# Patient Record
Sex: Female | Born: 1980
Health system: Southern US, Community
[De-identification: ages and names within clinical notes are randomized; demographics above are authoritative.]

## PROBLEM LIST (undated history)

## (undated) ENCOUNTER — Inpatient Hospital Stay (HOSPITAL_COMMUNITY): Payer: Self-pay

## (undated) DIAGNOSIS — R87629 Unspecified abnormal cytological findings in specimens from vagina: Secondary | ICD-10-CM

## (undated) DIAGNOSIS — B977 Papillomavirus as the cause of diseases classified elsewhere: Secondary | ICD-10-CM

## (undated) DIAGNOSIS — F419 Anxiety disorder, unspecified: Secondary | ICD-10-CM

## (undated) DIAGNOSIS — O24419 Gestational diabetes mellitus in pregnancy, unspecified control: Secondary | ICD-10-CM

## (undated) DIAGNOSIS — K8020 Calculus of gallbladder without cholecystitis without obstruction: Secondary | ICD-10-CM

## (undated) HISTORY — PX: MOUTH SURGERY: SHX715

## (undated) HISTORY — DX: Unspecified abnormal cytological findings in specimens from vagina: R87.629

---
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
## 2002-05-27 ENCOUNTER — Other Ambulatory Visit: Admission: RE | Admit: 2002-05-27 | Discharge: 2002-05-27 | Payer: Self-pay | Admitting: *Deleted

## 2003-03-25 ENCOUNTER — Inpatient Hospital Stay (HOSPITAL_COMMUNITY): Admission: AD | Admit: 2003-03-25 | Discharge: 2003-03-27 | Payer: Self-pay | Admitting: *Deleted

## 2003-10-25 ENCOUNTER — Emergency Department (HOSPITAL_COMMUNITY): Admission: EM | Admit: 2003-10-25 | Discharge: 2003-10-25 | Payer: Self-pay | Admitting: Emergency Medicine

## 2004-04-29 ENCOUNTER — Emergency Department (HOSPITAL_COMMUNITY): Admission: EM | Admit: 2004-04-29 | Discharge: 2004-04-29 | Payer: Self-pay | Admitting: Emergency Medicine

## 2004-06-02 ENCOUNTER — Emergency Department (HOSPITAL_COMMUNITY): Admission: EM | Admit: 2004-06-02 | Discharge: 2004-06-02 | Payer: Self-pay | Admitting: Family Medicine

## 2004-09-05 ENCOUNTER — Inpatient Hospital Stay (HOSPITAL_COMMUNITY): Admission: AD | Admit: 2004-09-05 | Discharge: 2004-09-05 | Payer: Self-pay | Admitting: Family Medicine

## 2004-09-15 ENCOUNTER — Other Ambulatory Visit: Admission: RE | Admit: 2004-09-15 | Discharge: 2004-09-15 | Payer: Self-pay | Admitting: Obstetrics and Gynecology

## 2005-05-07 ENCOUNTER — Inpatient Hospital Stay (HOSPITAL_COMMUNITY): Admission: AD | Admit: 2005-05-07 | Discharge: 2005-05-09 | Payer: Self-pay | Admitting: Obstetrics and Gynecology

## 2005-05-18 ENCOUNTER — Emergency Department (HOSPITAL_COMMUNITY): Admission: EM | Admit: 2005-05-18 | Discharge: 2005-05-18 | Payer: Self-pay | Admitting: Emergency Medicine

## 2005-05-19 ENCOUNTER — Emergency Department (HOSPITAL_COMMUNITY): Admission: EM | Admit: 2005-05-19 | Discharge: 2005-05-19 | Payer: Self-pay | Admitting: Family Medicine

## 2005-09-12 ENCOUNTER — Other Ambulatory Visit: Admission: RE | Admit: 2005-09-12 | Discharge: 2005-09-12 | Payer: Self-pay | Admitting: Obstetrics and Gynecology

## 2006-05-30 ENCOUNTER — Emergency Department (HOSPITAL_COMMUNITY): Admission: EM | Admit: 2006-05-30 | Discharge: 2006-05-30 | Payer: Self-pay | Admitting: Family Medicine

## 2006-09-15 ENCOUNTER — Other Ambulatory Visit: Admission: RE | Admit: 2006-09-15 | Discharge: 2006-09-15 | Payer: Self-pay | Admitting: Obstetrics and Gynecology

## 2007-05-31 ENCOUNTER — Emergency Department (HOSPITAL_COMMUNITY): Admission: EM | Admit: 2007-05-31 | Discharge: 2007-05-31 | Payer: Self-pay | Admitting: Emergency Medicine

## 2007-10-29 ENCOUNTER — Emergency Department (HOSPITAL_COMMUNITY): Admission: EM | Admit: 2007-10-29 | Discharge: 2007-10-29 | Payer: Self-pay | Admitting: Family Medicine

## 2008-09-13 ENCOUNTER — Emergency Department (HOSPITAL_COMMUNITY): Admission: EM | Admit: 2008-09-13 | Discharge: 2008-09-13 | Payer: Self-pay | Admitting: Family Medicine

## 2009-05-04 ENCOUNTER — Emergency Department (HOSPITAL_COMMUNITY): Admission: EM | Admit: 2009-05-04 | Discharge: 2009-05-04 | Payer: Self-pay | Admitting: Family Medicine

## 2009-11-25 ENCOUNTER — Emergency Department (HOSPITAL_COMMUNITY): Admission: EM | Admit: 2009-11-25 | Discharge: 2009-11-25 | Payer: Self-pay | Admitting: Family Medicine

## 2010-11-21 ENCOUNTER — Emergency Department (HOSPITAL_COMMUNITY)
Admission: EM | Admit: 2010-11-21 | Discharge: 2010-11-21 | Payer: Self-pay | Source: Home / Self Care | Admitting: Family Medicine

## 2010-11-23 ENCOUNTER — Emergency Department (HOSPITAL_BASED_OUTPATIENT_CLINIC_OR_DEPARTMENT_OTHER): Admission: EM | Admit: 2010-11-23 | Discharge: 2010-11-23 | Payer: Self-pay | Admitting: Emergency Medicine

## 2011-03-29 LAB — POCT I-STAT, CHEM 8
BUN: 6 mg/dL (ref 6–23)
Calcium, Ion: 1.24 mmol/L (ref 1.12–1.32)
Chloride: 99 mEq/L (ref 96–112)
Creatinine, Ser: 0.9 mg/dL (ref 0.4–1.2)
Glucose, Bld: 78 mg/dL (ref 70–99)
HCT: 44 % (ref 36.0–46.0)
Hemoglobin: 15 g/dL (ref 12.0–15.0)
Potassium: 4.1 mEq/L (ref 3.5–5.1)
Sodium: 138 mEq/L (ref 135–145)
TCO2: 29 mmol/L (ref 0–100)

## 2011-03-29 LAB — WET PREP, GENITAL
Trich, Wet Prep: NONE SEEN
Yeast Wet Prep HPF POC: NONE SEEN

## 2011-03-29 LAB — POCT URINALYSIS DIP (DEVICE)
Bilirubin Urine: NEGATIVE
Glucose, UA: NEGATIVE mg/dL
Ketones, ur: NEGATIVE mg/dL
Nitrite: NEGATIVE
Protein, ur: NEGATIVE mg/dL
Specific Gravity, Urine: 1.01 (ref 1.005–1.030)
Urobilinogen, UA: 0.2 mg/dL (ref 0.0–1.0)
pH: 7 (ref 5.0–8.0)

## 2011-03-29 LAB — CBC
HCT: 38.6 % (ref 36.0–46.0)
Hemoglobin: 12.5 g/dL (ref 12.0–15.0)
MCHC: 32.4 g/dL (ref 30.0–36.0)
MCV: 77 fL — ABNORMAL LOW (ref 78.0–100.0)
Platelets: 352 10*3/uL (ref 150–400)
RBC: 5.01 MIL/uL (ref 3.87–5.11)
RDW: 13.3 % (ref 11.5–15.5)
WBC: 9.5 10*3/uL (ref 4.0–10.5)

## 2011-03-29 LAB — DIFFERENTIAL
Basophils Absolute: 0 10*3/uL (ref 0.0–0.1)
Basophils Relative: 0 % (ref 0–1)
Eosinophils Absolute: 0.1 10*3/uL (ref 0.0–0.7)
Eosinophils Relative: 1 % (ref 0–5)
Lymphocytes Relative: 36 % (ref 12–46)
Lymphs Abs: 3.4 10*3/uL (ref 0.7–4.0)
Monocytes Absolute: 0.4 10*3/uL (ref 0.1–1.0)
Monocytes Relative: 5 % (ref 3–12)
Neutro Abs: 5.5 10*3/uL (ref 1.7–7.7)
Neutrophils Relative %: 58 % (ref 43–77)

## 2011-03-29 LAB — POCT PREGNANCY, URINE: Preg Test, Ur: NEGATIVE

## 2011-03-29 LAB — GC/CHLAMYDIA PROBE AMP, GENITAL
Chlamydia, DNA Probe: NEGATIVE
GC Probe Amp, Genital: NEGATIVE

## 2011-04-05 LAB — POCT URINALYSIS DIP (DEVICE)
Bilirubin Urine: NEGATIVE
Glucose, UA: NEGATIVE mg/dL
Ketones, ur: NEGATIVE mg/dL
Nitrite: NEGATIVE
Protein, ur: NEGATIVE mg/dL
Specific Gravity, Urine: >=1.03 (ref 1.005–1.030)
Urobilinogen, UA: 1 mg/dL (ref 0.0–1.0)
pH: 6 (ref 5.0–8.0)

## 2011-04-05 LAB — WET PREP, GENITAL
Trich, Wet Prep: NONE SEEN
Yeast Wet Prep HPF POC: NONE SEEN

## 2011-04-05 LAB — POCT PREGNANCY, URINE: Preg Test, Ur: NEGATIVE

## 2011-04-05 LAB — GC/CHLAMYDIA PROBE AMP, GENITAL
Chlamydia, DNA Probe: NEGATIVE
GC Probe Amp, Genital: NEGATIVE

## 2011-04-15 ENCOUNTER — Emergency Department (INDEPENDENT_AMBULATORY_CARE_PROVIDER_SITE_OTHER): Payer: Self-pay

## 2011-04-15 ENCOUNTER — Emergency Department (HOSPITAL_BASED_OUTPATIENT_CLINIC_OR_DEPARTMENT_OTHER)
Admission: EM | Admit: 2011-04-15 | Discharge: 2011-04-15 | Disposition: A | Discharge status: Home / Self Care | Payer: Self-pay | Attending: Emergency Medicine | Admitting: Emergency Medicine

## 2011-04-15 DIAGNOSIS — R059 Cough, unspecified: Secondary | ICD-10-CM | POA: Insufficient documentation

## 2011-04-15 DIAGNOSIS — R05 Cough: Secondary | ICD-10-CM

## 2011-04-15 DIAGNOSIS — F17200 Nicotine dependence, unspecified, uncomplicated: Secondary | ICD-10-CM

## 2011-04-15 DIAGNOSIS — J40 Bronchitis, not specified as acute or chronic: Secondary | ICD-10-CM | POA: Insufficient documentation

## 2011-04-15 LAB — URINE MICROSCOPIC-ADD ON

## 2011-04-15 LAB — URINALYSIS, ROUTINE W REFLEX MICROSCOPIC
Bilirubin Urine: NEGATIVE
Glucose, UA: NEGATIVE mg/dL
Hgb urine dipstick: NEGATIVE
Ketones, ur: NEGATIVE mg/dL
Leukocytes, UA: NEGATIVE
Nitrite: NEGATIVE
Protein, ur: NEGATIVE mg/dL
Specific Gravity, Urine: 1.017 (ref 1.005–1.030)
Urobilinogen, UA: 0.2 mg/dL (ref 0.0–1.0)
pH: 7.5 (ref 5.0–8.0)

## 2011-04-15 LAB — PREGNANCY, URINE: Preg Test, Ur: NEGATIVE

## 2011-05-13 NOTE — H&P (Signed)
Samantha Harris              ACCOUNT NO.:  [id number]   MEDICAL RECORD NO.:  [id number]          PATIENT TYPE:  INP   LOCATION:  9164                          FACILITY:  [facility]   PHYSICIAN:  Crist Fat. Rivard, M.D. DATE OF BIRTH:  December 27, 1980   DATE OF ADMISSION:  05/07/2005  DATE OF DISCHARGE:                                HISTORY & PHYSICAL   HISTORY OF PRESENT ILLNESS:  Samantha Harris is a 30 year old single black  female, gravida 2, para 1-0-1-1, at 41-1/7 weeks who presents complaining of  leaking since about 2 a.m. and now with uterine contractions every five  minutes.  She denies any nausea, vomiting, headaches, or visual  disturbances.  She reports positive fetal movement.  She reports that she  was seen in the office on 5/12 and was about 2 cm at that time.  Her  pregnancy has been followed at [facility] by the certified  nurse midwife service and has been at risk for:  1. Questionable LMP.  2.  History of migraines.  3. History of HPV.  4. Positive group B strep.   OBSTETRICAL-GYNECOLOGICAL HISTORY:  She is a gravida 2, para 1-0-0-1, who  delivered a viable female infant in 3/04 who weighed 7 pounds, 5 ounces at [redacted]  weeks gestation following a 4 hour labor without complication.  She did have  an epidural for that delivery.  She also was GBS positive with that  delivery.   GENERAL MEDICAL HISTORY:  She has no known drug allergies.  She reports  having had the usual childhood diseases.  She has a history of occasional  migraines and her only hospitalization was for childbirth.   FAMILY HISTORY:  Significant for a father with hypertension and heart  disease, sister and fraternal grandmother with diabetes, mother with some  kind of cancer, and there is a family history of colon cancer.   GENETIC HISTORY:  Negative.  She is still involved with the same partner as  her previous child.  She is of the [place] faith.  She denies any illicit  drug use, alcohol, or  smoking with this pregnancy.   PRENATAL LABORATORIES:  Her blood type is O positive, her antibody screen is  negative.  Sickle cell trait is negative.  Syphilis is nonreactive.  Rubella  is immune.  Hepatitis B  surface antigen is negative.  HIV is nonreactive.  Cystic fibrosis is negative.  GC and Chlamydia are both negative.  Pap is  within normal limits.  Her group B strep at 36 weeks is positive.   PHYSICAL EXAMINATION:  Her vital signs are stable.  She is afebrile.  Her  HEENT is grossly within normal limits.  Her heart is regular rhythm and  rate.  Her chest is clear.  Breasts are soft and nontender.  Her abdomen is  gravid.  Her uterine contractions are every 4-6 minutes.  Fetal heart rate  is reactive.  Serial pelvic exam is 6 cm, 100% vertex at +1 with meconium  stained fluid noted per the RN.  Her extremities are within normal limits.   ASSESSMENT:  1.  Intrauterine pregnancy at 41-1/7 weeks.  2. Active labor.  3. Meconium      stained fluid.  4. Positive group B strep.  5. History of rapid labor.   PLAN:  Admit to labor and delivery, to follow routine CNM orders, to give  her ampicillin IV for group B strep prophylaxis, to anticipate imminent  delivery, and to notify Dr. Estanislado Pandy of patient's admission.      SJD/MEDQ  D:  05/07/2005  T:  05/07/2005  Job:  [id number]

## 2011-05-13 NOTE — H&P (Signed)
NAMEHEATHERLY, STENNER                        ACCOUNT NO.:  [id number]   MEDICAL RECORD NO.:  [id number]                   PATIENT TYPE:  MAT   LOCATION:  MATC                                 [facility]:  [facility]   PHYSICIAN:  Crist Fat. Rivard, M.D.              DATE OF BIRTH:  [birth year]   DATE OF ADMISSION:  03/25/2003  DATE OF DISCHARGE:                                HISTORY & PHYSICAL   HISTORY:  Ms. Benassi is a 30 year old single black female prima gravida at  40-2/7 weeks who presents with leaking fluid since 0100 Hr.  She reports  uterine contractions, irregular all night, and more regular after rupture of  membranes.  She denies bleeding, headache, nausea, vomiting or visual  disturbances.  Her pregnancy has been followed by [facility]  certified nurse midwife service and has been remarkable for:  1. History of HPV.  2. Group B strep positive.   PRENATAL LABS:  Collected on 08/20/02.  Hemoglobin 11.4, hematocrit 33.9,  platelets 405,000.  Blood type O positive, antibody negative.  Sickle cell  trait negative.  RPR nonreactive.  Rubella immune.  Hepatitis B surface  antigen negative.  HIV nonreactive.  Pap smear within normal limits.  Gonorrhea negative, Chlamydia negative.  Wet prep negative.  Maternal fetal  alpha fetoprotein on 09/25/02 was within normal range.  One hour Glucola on  12/18/02 was 64 and her hemoglobin at that time was 10.4. Culture of the  vaginal tract for group B strep 02/28/03 was positive and for gonorrhea and  Chlamydia at that time was negative.   HISTORY OF PRESENT PREGNANCY:  She presented for care on 09/25/02 at 14-1/[redacted]  weeks gestation to [facility] for prenatal care.  She was  transferring from the [facility].  Pregnancy ultrasonography at [redacted] weeks  gestation shows growth consistent with previous dating with EDC of 03/23/03.  The rest of her pr ental care was unremarkable.   OB HISTORY:  She is a primigravida.   MEDICAL  HISTORY:  She has no medication allergies.  In 8/03 she was  diagnosed with genital warts. She has had yeast infections with this  pregnancy.  She reports having had the usual childhood illnesses.  She has a  history of migraines.   SURGERIES:  She has had no surgeries.   FAMILY HISTORY:  Remarkable for a father with myocardial infarction.  Paternal grandfather and paternal grandmother with chronic hypertension.  Sister and paternal grandmother with diabetes.  Mother with ulcers. Colon  cancer runs in the family.  Paternal grandmother with CVA.  Mother, brother  and father with history of migraines.  Mother with history of sexual abuse.   GENETIC HISTORY:  Unremarkable.   SOCIAL HISTORY:  The father of the baby is involved and supportive Minerva Areola).  They are of the [place] faith.  The patient has a high school education  and works part-time as  a Conservation officer, nature.  The father of the baby has a tenth grade  education and is employed in [employer].  She denies any alcohol or  illicit drug use with the pregnancy.  She smoked up until 8/03.   PHYSICAL EXAMINATION:  VITAL SIGNS: Stable, she is afebrile.  HEENT: Grossly within normal limits.  CHEST: Clear to auscultation.  HEART: Regular rate and rhythm.  ABDOMEN: Gravid in contour with fundal height extending approximately 40 cm  above the pubic symphysis.  Fetal heart rate is reassuring with occasional  mild variables.  Uterine contractions every one and one-half minutes lasting  70 seconds and strong to palpation.  CERVICAL:  Five cm, 100% effaced, vertex -1 with caput present and no  membrane felt on exam.  No sterile speculum exam was performed secondary to  patient discomfort but positive Nitrazine and negative fern to an external  swab, no fluid is visible.  EXTREMITIES: Within normal limits.   ASSESSMENT:  1. Intrauterine pregnancy at term.  2. Active labor.  3. Probable spontaneous rupture of membranes.  4. Group B strep  positive.   PLAN:  1. Admit emergently and call for consultation with Dr. Estanislado Pandy.  2. Routine ___ and waters.  3. Penicillin G for group B strep prophylaxis.     Cam Hai, C.N.M.                     Crist Fat Rivard, M.D.    KS/MEDQ  D:  03/25/2003  T:  03/25/2003  Job:  [id number]   cc:   Dois Davenport A. Rivard, M.D.  [address]  [place]  [place] [address]  Fax: [phone]

## 2011-07-06 ENCOUNTER — Inpatient Hospital Stay (INDEPENDENT_AMBULATORY_CARE_PROVIDER_SITE_OTHER)
Admission: RE | Admit: 2011-07-06 | Discharge: 2011-07-06 | Disposition: A | Discharge status: Home / Self Care | Payer: Self-pay | Source: Ambulatory Visit | Attending: Emergency Medicine | Admitting: Emergency Medicine

## 2011-07-06 DIAGNOSIS — IMO0002 Reserved for concepts with insufficient information to code with codable children: Secondary | ICD-10-CM

## 2011-07-06 DIAGNOSIS — N949 Unspecified condition associated with female genital organs and menstrual cycle: Secondary | ICD-10-CM

## 2011-07-06 LAB — POCT URINALYSIS DIP (DEVICE)
Bilirubin Urine: NEGATIVE
Glucose, UA: NEGATIVE mg/dL
Hgb urine dipstick: NEGATIVE
Ketones, ur: NEGATIVE mg/dL
Leukocytes, UA: NEGATIVE
Nitrite: NEGATIVE
Protein, ur: NEGATIVE mg/dL
Specific Gravity, Urine: 1.02 (ref 1.005–1.030)
Urobilinogen, UA: 0.2 mg/dL (ref 0.0–1.0)
pH: 7 (ref 5.0–8.0)

## 2011-07-06 LAB — POCT PREGNANCY, URINE: Preg Test, Ur: NEGATIVE

## 2011-09-26 LAB — POCT URINALYSIS DIP (DEVICE)
Glucose, UA: NEGATIVE
Ketones, ur: NEGATIVE
Nitrite: NEGATIVE
Operator id: 30745
Protein, ur: NEGATIVE
Specific Gravity, Urine: 1.025
Urobilinogen, UA: 2 — ABNORMAL HIGH
pH: 6.5

## 2011-09-26 LAB — POCT PREGNANCY, URINE: Preg Test, Ur: POSITIVE

## 2011-10-13 LAB — POCT PREGNANCY, URINE
Operator id: 272551
Preg Test, Ur: NEGATIVE

## 2011-10-13 LAB — RAPID STREP SCREEN (MED CTR MEBANE ONLY): Streptococcus, Group A Screen (Direct): NEGATIVE

## 2011-11-28 ENCOUNTER — Ambulatory Visit (INDEPENDENT_AMBULATORY_CARE_PROVIDER_SITE_OTHER): Payer: Medicaid Other | Admitting: Family Medicine

## 2011-11-28 ENCOUNTER — Encounter: Payer: Self-pay | Admitting: Family Medicine

## 2011-11-28 DIAGNOSIS — F4001 Agoraphobia with panic disorder: Secondary | ICD-10-CM | POA: Insufficient documentation

## 2011-11-28 DIAGNOSIS — N938 Other specified abnormal uterine and vaginal bleeding: Secondary | ICD-10-CM

## 2011-11-28 DIAGNOSIS — Z720 Tobacco use: Secondary | ICD-10-CM | POA: Insufficient documentation

## 2011-11-28 DIAGNOSIS — F17200 Nicotine dependence, unspecified, uncomplicated: Secondary | ICD-10-CM

## 2011-11-28 DIAGNOSIS — N949 Unspecified condition associated with female genital organs and menstrual cycle: Secondary | ICD-10-CM

## 2011-11-28 MED ORDER — BUSPIRONE HCL 7.5 MG PO TABS: 7.5000 mg | ORAL_TABLET | Freq: Two times a day (BID) | ORAL | Status: DC

## 2011-11-28 NOTE — Assessment & Plan Note (Signed)
Will start with buspar for treatment.  Patient encouraged to consider therapy for coping strategies.  She will follow up in a month or two for well woman exam, will see how her anxiety symptoms are at that time.

## 2011-11-28 NOTE — Assessment & Plan Note (Signed)
Spent some time discussing reasons/motivations for quitting, barriers to quitting.  Patient in contemplative stage, has not decided she wants a nicotine replacement or chantix at this point, but knows they are available.

## 2011-11-28 NOTE — Progress Notes (Signed)
  Subjective:    Patient ID: Samantha Harris, female    DOB: 12-Aug-1981, 30 y.o.   MRN: [id number]  HPI  Patient comes in to establish care.  She has several complaints.    Patient used to see an OB doctor about 10 years ago.  She says that she had some pelvic pain and abnormal bleeding back then, and they had planned to get an [place], but she never had it done.  This was shortly after her second son was born.  She says that right now her periods are every 28 days, and last about 4 days, and she does not have much pain with menstruation.    She also complains that she has had some increased anxiety and panic attacks lately.  She says she has always been claustrophobic, especially in elevators.  However, recently, she has had difficulty going into crowded stores.  She says she has had a few panic attacks, she says she feels very hot and feels like she is in danger, and has to leave the public place.  She has never been on medication or had any therapy for her anxiety or panic attacks.   The patient also wants to quit smoking.  She says she has been smoking on and off for more than 10 years, but quit during her two pregnancies.  She wants to quit because her two young sons don't like that she smokes, and for her long-term health.  She has cut back from smoking about two packs per week to about one pack over a week's time.  She has not tried any nicotine replacement, and states she might want to try cold [place].   Review of Systems Pertinent items noted in HPI    Objective:   Physical Exam BP 125/77  Pulse 94  Temp(Src) 98.3 F (36.8 C) (Oral)  Ht 5' 6.5" (1.689 m)  Wt 212 lb (96.163 kg)  BMI 33.71 kg/m2  LMP 11/08/2011 General appearance: alert, cooperative and no distress Throat: lips, mucosa, and tongue normal; teeth and gums normal Neck: no adenopathy, supple, symmetrical, trachea midline and thyroid not enlarged, symmetric, no tenderness/mass/nodules Lungs: clear to auscultation  bilaterally Heart: regular rate and rhythm, S1, S2 normal, no murmur, click, rub or gallop Abdomen: soft, non-tender; bowel sounds normal; no masses,  no organomegaly Extremities: extremities normal, atraumatic, no cyanosis or edema Pulses: 2+ and symmetric Psych: Patient has normal thought content and judgement, appears calm, normal speech.  Does not appear to be responding to any internal stimuli.        Assessment & Plan:

## 2011-11-28 NOTE — Patient Instructions (Signed)
It was nice to meet you.  We will try to get records from your old OB GYN to sort out why they wanted an ultrasound.  Please try the medication Buspirone for your anxiety.  If you think therapy would help you work on your panic attacks, please contact Dr. Pascal Lux, our psychologist.    Please come back and see me for your well woman exam in the next few months.

## 2011-11-28 NOTE — Assessment & Plan Note (Signed)
Patient is not currently having symptoms, but is concerned that old OB wanted [place].  Will get records to see why they wanted an [place].  Patient to follow up in 1-2 months for well woman exam.

## 2012-03-06 ENCOUNTER — Encounter: Payer: Self-pay | Admitting: Family Medicine

## 2012-03-06 ENCOUNTER — Ambulatory Visit (INDEPENDENT_AMBULATORY_CARE_PROVIDER_SITE_OTHER): Payer: Medicaid Other | Admitting: Family Medicine

## 2012-03-06 VITALS — BP 117/76 | HR 91 | Temp 98.1°F | Ht 66.5 in | Wt 213.0 lb

## 2012-03-06 DIAGNOSIS — Z800 Family history of malignant neoplasm of digestive organs: Secondary | ICD-10-CM

## 2012-03-06 DIAGNOSIS — F17200 Nicotine dependence, unspecified, uncomplicated: Secondary | ICD-10-CM

## 2012-03-06 DIAGNOSIS — L732 Hidradenitis suppurativa: Secondary | ICD-10-CM | POA: Insufficient documentation

## 2012-03-06 DIAGNOSIS — Z720 Tobacco use: Secondary | ICD-10-CM

## 2012-03-06 DIAGNOSIS — F4001 Agoraphobia with panic disorder: Secondary | ICD-10-CM

## 2012-03-06 NOTE — Progress Notes (Signed)
  Subjective:    Patient ID: Samantha Harris, female    DOB: October 18, 1981, 31 y.o.   MRN: [id number]  HPI  Samantha Harris comes in for follow up.  Samantha Harris was supposed to get her pap today but is on her period and declines it.    Anxiety- Samantha Harris says Samantha Harris had stopped taking the buspar after a few weeks when it was first prescribed because Samantha Harris felt out of it.  However, Samantha Harris started school in January, so Samantha Harris started taking it again.  Samantha Harris says that the medication helps her be able to go into a crowded room with minimal anxiety, and no panic attacks.  Samantha Harris says Samantha Harris is doing OK concentrating at school.  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   Tobacco abuse- Samantha Harris has cut back, now smoking a pack over about a 2 week period.  Samantha Harris says it is very important to her to quit, and that the only reason Samantha Harris smokes is because Samantha Harris likes how it relaxes her.  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   Hidradenitis- Pt says Samantha Harris does not have any symptoms, but has had this again since Samantha Harris saw me last.  Samantha Harris wants to know what it is and how to make it go away.  Samantha Harris remembers her OBGYN telling her that weight loss can help.    Family History of Colon Cancer- Samantha Harris states that Samantha Harris talked to her mother and found out for sure that her mother was diagnosed with colon cancer at the age of 56.  Samantha Harris does not know specifically what kind of colon cancer it was.  Samantha Harris has never had a colonoscopy, and no one else in the family has had colon cancer.  Samantha Harris has no difficulty with bowel movements, changes in bowel habits, blood in her stool.    Review of Systems Pertinent items in HPI.     Objective:   Physical Exam BP 117/76  Pulse 91  Temp(Src) 98.1 F (36.7 C) (Oral)  Ht 5' 6.5" (1.689 m)  Wt 213 lb (96.616 kg)  BMI 33.86 kg/m2  LMP 03/06/2012 General appearance: alert, cooperative and no distress Head: Normocephalic, without obvious abnormality, atraumatic Lungs: clear to auscultation bilaterally Heart: regular rate  and rhythm, S1, S2 normal, no murmur, click, rub or gallop Abdomen: soft, non-tender; bowel sounds normal; no masses,  no organomegaly Extremities: extremities normal, atraumatic, no cyanosis or edema and No abscess in axilla currently but there is scarring of old drained abscess.  Pulses: 2+ and symmetric Skin: Skin color, texture, turgor normal. No rashes or lesions Neurologic: Grossly normal Psych: Patient appears calm, in good mood, judgement insight, thought content and speech normal.        Assessment & Plan:

## 2012-03-06 NOTE — Patient Instructions (Signed)
It was good to see you.  I am glad your anxiety is better and that you are able to concentrate at school.  Please keep trying to quit smoking- you are almost there.    Please make an appointment in about 3 months (when you are not on your period) so we can do your pap smear.  The office will call you about a referral to Gastroenterology (stomach doctors).

## 2012-03-06 NOTE — Assessment & Plan Note (Signed)
Will refer to GI, pt likely needs screening colonoscopy soon.

## 2012-03-06 NOTE — Assessment & Plan Note (Signed)
Improved on buspar, patient able to function better.  No changes in medication, will monitor.

## 2012-03-06 NOTE — Assessment & Plan Note (Signed)
No current symptoms, but did provide pt hand out about condition.  Encouraged weight loss.

## 2012-03-06 NOTE — Assessment & Plan Note (Signed)
Improvement towards goal, patient declines any further assistance, I will continue to encourage her.

## 2012-03-14 ENCOUNTER — Encounter: Payer: Self-pay | Admitting: Internal Medicine

## 2012-03-20 ENCOUNTER — Ambulatory Visit (INDEPENDENT_AMBULATORY_CARE_PROVIDER_SITE_OTHER): Payer: Medicaid Other | Admitting: Family Medicine

## 2012-03-20 VITALS — BP 125/76 | HR 90 | Temp 98.7°F | Ht 66.5 in | Wt 209.0 lb

## 2012-03-20 DIAGNOSIS — L732 Hidradenitis suppurativa: Secondary | ICD-10-CM

## 2012-03-20 MED ORDER — DOXYCYCLINE HYCLATE 100 MG PO TABS: 100.0000 mg | ORAL_TABLET | Freq: Two times a day (BID) | ORAL | Status: AC

## 2012-03-20 NOTE — Patient Instructions (Signed)
Hidradenitis Suppurativa, Sweat Gland Abscess Hidradenitis suppurativa is a long lasting (chronic), uncommon disease of the sweat glands. With this, boil-like lumps and scarring develop in the groin, some times under the arms (axillae), and under the breasts. It may also uncommonly occur behind the ears, in the crease of the buttocks, and around the genitals.  CAUSES  The cause is from a blocking of the sweat glands. They then become infected. It may cause drainage and odor. It is not contagious. So it cannot be given to someone else. It most often shows up in puberty (about 10 to 30 years of age). But it may happen much later. It is similar to acne which is a disease of the sweat glands. This condition is slightly more common in African-Americans and women. SYMPTOMS   Hidradenitis usually starts as one or more red, tender, swellings in the groin or under the arms (axilla).   Over a period of hours to days the lesions get larger. They often open to the skin surface, draining clear to yellow-colored fluid.   The infected area heals with scarring.  DIAGNOSIS  Your caregiver makes this diagnosis by looking at you. Sometimes cultures (growing germs on plates in the lab) may be taken. This is to see what germ (bacterium) is causing the infection.  TREATMENT   Topical germ killing medicine applied to the skin (antibiotics) are the treatment of choice. Antibiotics taken by mouth (systemic) are sometimes needed when the condition is getting worse or is severe.   Avoid tight-fitting clothing which traps moisture in.   Dirt does not cause hidradenitis and it is not caused by poor hygiene.   Involved areas should be cleaned daily using an antibacterial soap. Some patients find that the liquid form of Lever 2000, applied to the involved areas as a lotion after bathing, can help reduce the odor related to this condition.   Sometimes surgery is needed to drain infected areas or remove scarred tissue.  Removal of large amounts of tissue is used only in severe cases.   Birth control pills may be helpful.   Oral retinoids (vitamin A derivatives) for 6 to 12 months which are effective for acne may also help this condition.   Weight loss will improve but not cure hidradenitis. It is made worse by being overweight. But the condition is not caused by being overweight.   This condition is more common in people who have had acne.   It may become worse under stress.  There is no medical cure for hidradenitis. It can be controlled, but not cured. The condition usually continues for years with periods of getting worse and getting better (remission). Document Released: 07/26/2004 Document Revised: 12/01/2011 Document Reviewed: 08/11/2008 ExitCare Patient Information 2012 [employer]. 

## 2012-03-21 NOTE — Progress Notes (Signed)
  Subjective:    Patient ID: Samantha Harris, female    DOB: [birth year], 31 y.o.   MRN: [id number]  HPI Pt presents today with chief complaint of R axilla swelling and pain.  Symptoms have been present for the last 3-4 days. No fever or purulent drainage. Mild redness and swelling. Pain has been poorly controlled with NSAIDs and tylenol.  Pt states that she has a prior history of hidradenitis in the past. This is similar to previous presentation.    Review of Systems     Objective:   Physical Exam Gen: in bed, NAD  Skin:         Assessment & Plan:

## 2012-03-22 NOTE — Assessment & Plan Note (Addendum)
Acute flare today. Will place on oral doxy. Discussed supportive care and infectious red flags. No indications for I and D today. Did broach issue of intralesional steroids if lesions recalcitrant. Handout given.

## 2012-05-07 ENCOUNTER — Encounter: Payer: Self-pay | Admitting: Family Medicine

## 2012-05-07 ENCOUNTER — Ambulatory Visit (INDEPENDENT_AMBULATORY_CARE_PROVIDER_SITE_OTHER): Payer: Medicaid Other | Admitting: Family Medicine

## 2012-05-07 ENCOUNTER — Other Ambulatory Visit (HOSPITAL_COMMUNITY)
Admission: RE | Admit: 2012-05-07 | Discharge: 2012-05-07 | Disposition: A | Discharge status: Home / Self Care | Payer: Medicaid Other | Source: Ambulatory Visit | Attending: Family Medicine | Admitting: Family Medicine

## 2012-05-07 VITALS — BP 134/84 | HR 99 | Temp 97.8°F | Ht 67.0 in | Wt 212.0 lb

## 2012-05-07 DIAGNOSIS — L732 Hidradenitis suppurativa: Secondary | ICD-10-CM

## 2012-05-07 DIAGNOSIS — N912 Amenorrhea, unspecified: Secondary | ICD-10-CM

## 2012-05-07 DIAGNOSIS — Z01419 Encounter for gynecological examination (general) (routine) without abnormal findings: Secondary | ICD-10-CM | POA: Insufficient documentation

## 2012-05-07 DIAGNOSIS — N760 Acute vaginitis: Secondary | ICD-10-CM

## 2012-05-07 DIAGNOSIS — Z124 Encounter for screening for malignant neoplasm of cervix: Secondary | ICD-10-CM

## 2012-05-07 DIAGNOSIS — Z113 Encounter for screening for infections with a predominantly sexual mode of transmission: Secondary | ICD-10-CM | POA: Insufficient documentation

## 2012-05-07 DIAGNOSIS — Z3201 Encounter for pregnancy test, result positive: Secondary | ICD-10-CM | POA: Insufficient documentation

## 2012-05-07 LAB — POCT URINE PREGNANCY: Preg Test, Ur: POSITIVE

## 2012-05-07 LAB — POCT WET PREP (WET MOUNT)

## 2012-05-07 NOTE — Assessment & Plan Note (Signed)
I told her this was positive, she became upset, she is not sure who the father is, and unsure of her last menstrual period. Will order dating [place].  Patient is not sure if she wants this pregnancy or not.  I let her know she can get her prenatal care her at our clinic if she wants, but I am happy to make any referrals she needs. Patient states she will call her best friend as soon as she leaves and does not need me to call anyone for her.

## 2012-05-07 NOTE — Assessment & Plan Note (Signed)
This has improved.  Discussed disease process and that prophylactic surgery would be an extreme step, that surgery was more of a last resort option.  Discussed weight loss as best treatment.

## 2012-05-07 NOTE — Progress Notes (Signed)
  Subjective:    Patient ID: Samantha Harris, female    DOB: 03-Nov-1981, 31 y.o.   MRN: [id number]  HPI  Samantha Harris comes in for her well woman exam. She says she would like a pregnancy test and STD check.  She says she had unprotected intercourse with a new partner sometime in the middle of April.  She thinks she had her last period at the beginning of April, but is unsure.    She says the boil under her armpit has gotten better, she is no longer having as much pain.  She wanted to ask about surgery to prevent it from happening again.   Review of Systems  Constitutional: Negative for unexpected weight change.  HENT: Positive for congestion and postnasal drip.   Eyes: Negative for visual disturbance.  Respiratory: Negative for shortness of breath.   Cardiovascular: Negative for chest pain.  Gastrointestinal: Negative for abdominal pain.  Genitourinary: Negative for dysuria.  Skin: Negative for rash.       Objective:   Physical Exam  Vitals reviewed. Constitutional: She is oriented to person, place, and time. She appears well-developed and well-nourished.  HENT:  Head: Normocephalic and atraumatic.  Eyes: EOM are normal. Pupils are equal, round, and reactive to light.  Neck: No thyromegaly present.  Cardiovascular: Normal rate, regular rhythm and normal heart sounds.   Pulmonary/Chest: Effort normal and breath sounds normal.  Abdominal: Soft. Bowel sounds are normal.  Genitourinary: Vagina normal and uterus normal. Cervix exhibits no motion tenderness. Right adnexum displays no mass. Left adnexum displays no mass. No vaginal discharge found.  Lymphadenopathy:    She has no cervical adenopathy.  Neurological: She is alert and oriented to person, place, and time.          Assessment & Plan:

## 2012-05-07 NOTE — Assessment & Plan Note (Signed)
Pap and STD screening done, no abnormalities noted.

## 2012-05-07 NOTE — Patient Instructions (Signed)
Syerra, your pregnancy test today was positive.  Since you are unsure of the day of your last period, I have ordered a early dating ultrasound.  I will call you when I see the results from this ultrasound.  I also will call or send you a letter with the results of the other tests done today.

## 2012-05-10 ENCOUNTER — Telehealth: Payer: Self-pay | Admitting: Family Medicine

## 2012-05-10 NOTE — Telephone Encounter (Signed)
The patient called to ask about what cough medicine is safe to take given that she is pregnant. She was advised that mucinex is safe.

## 2012-05-11 ENCOUNTER — Telehealth: Payer: Self-pay | Admitting: Family Medicine

## 2012-05-11 ENCOUNTER — Ambulatory Visit (HOSPITAL_COMMUNITY)
Admission: RE | Admit: 2012-05-11 | Discharge: 2012-05-11 | Disposition: A | Discharge status: Home / Self Care | Payer: Medicaid Other | Source: Ambulatory Visit | Attending: Family Medicine | Admitting: Family Medicine

## 2012-05-11 ENCOUNTER — Encounter (HOSPITAL_COMMUNITY): Payer: Self-pay

## 2012-05-11 ENCOUNTER — Other Ambulatory Visit: Payer: Self-pay | Admitting: Family Medicine

## 2012-05-11 DIAGNOSIS — Z3689 Encounter for other specified antenatal screening: Secondary | ICD-10-CM | POA: Insufficient documentation

## 2012-05-11 DIAGNOSIS — Z3201 Encounter for pregnancy test, result positive: Secondary | ICD-10-CM

## 2012-05-11 DIAGNOSIS — E669 Obesity, unspecified: Secondary | ICD-10-CM | POA: Insufficient documentation

## 2012-05-11 HISTORY — DX: Papillomavirus as the cause of diseases classified elsewhere: B97.7

## 2012-05-11 HISTORY — DX: Anxiety disorder, unspecified: F41.9

## 2012-05-11 NOTE — Telephone Encounter (Signed)
Called patient to discuss her Ultrasound and dating that has confirmed a single intrauterine pregnancy, due date 01/02/13.  She says she called [facility] (where she got her prior OB care) to see if she could get an appointment, and has not heard back.  I let her know she can call the front office to schedule labs and then an OB appointment here if she wants.   I also let her know pap smear OK and GC/Chlamydia negative.

## 2012-05-29 ENCOUNTER — Ambulatory Visit: Payer: Medicaid Other | Admitting: Internal Medicine

## 2012-06-04 ENCOUNTER — Other Ambulatory Visit: Payer: Medicaid Other

## 2012-06-06 ENCOUNTER — Other Ambulatory Visit: Payer: Self-pay

## 2012-06-06 ENCOUNTER — Telehealth: Payer: Self-pay | Admitting: Family Medicine

## 2012-06-06 DIAGNOSIS — Z3201 Encounter for pregnancy test, result positive: Secondary | ICD-10-CM

## 2012-06-06 LAB — HIV ANTIBODY (ROUTINE TESTING W REFLEX): HIV: NONREACTIVE

## 2012-06-06 NOTE — Progress Notes (Signed)
PRENATAL LABS DONE S TODAY Samantha Harris

## 2012-06-06 NOTE — Telephone Encounter (Signed)
Patient is calling to have Pregnancy verification for renewal of Medicaid sent to Ms. Bracey Fax # [phone].  She needs it to also say [facility].

## 2012-06-07 LAB — OBSTETRIC PANEL
Antibody Screen: NEGATIVE
Basophils Absolute: 0 10*3/uL (ref 0.0–0.1)
Basophils Relative: 0 % (ref 0–1)
Eosinophils Absolute: 0.2 10*3/uL (ref 0.0–0.7)
Eosinophils Relative: 2 % (ref 0–5)
HCT: 33.1 % — ABNORMAL LOW (ref 36.0–46.0)
MCH: 22.9 pg — ABNORMAL LOW (ref 26.0–34.0)
MCHC: 31.4 g/dL (ref 30.0–36.0)
Monocytes Absolute: 0.4 10*3/uL (ref 0.1–1.0)
Neutro Abs: 5.6 10*3/uL (ref 1.7–7.7)
RDW: 15.3 % (ref 11.5–15.5)
Rh Type: POSITIVE

## 2012-06-07 LAB — CULTURE, OB URINE: Colony Count: 4000

## 2012-06-07 LAB — SICKLE CELL SCREEN: Sickle Cell Screen: NEGATIVE

## 2012-06-07 NOTE — Telephone Encounter (Signed)
Verification faxed to [phone].

## 2012-06-11 ENCOUNTER — Ambulatory Visit (INDEPENDENT_AMBULATORY_CARE_PROVIDER_SITE_OTHER): Payer: Self-pay | Admitting: Family Medicine

## 2012-06-11 ENCOUNTER — Encounter: Payer: Self-pay | Admitting: Family Medicine

## 2012-06-11 VITALS — BP 117/73 | Wt 211.0 lb

## 2012-06-11 DIAGNOSIS — Z331 Pregnant state, incidental: Secondary | ICD-10-CM

## 2012-06-11 DIAGNOSIS — Z3201 Encounter for pregnancy test, result positive: Secondary | ICD-10-CM

## 2012-06-11 MED ORDER — ONDANSETRON HCL 4 MG PO TABS: 4.0000 mg | ORAL_TABLET | Freq: Three times a day (TID) | ORAL | Status: AC | PRN

## 2012-06-11 NOTE — Progress Notes (Signed)
Note reviewed.  Following issues noted: Obesity and +FH diabetes: needs glucola asap.  Pt should come for lab visit for this. +marijuana, alcohol and tobacco use - needs counseling and resources for quitting.   Pt would be excellent candidate for pregnancy medical home case management services if she could qualify for medicaid.  If not, would refer to social work.

## 2012-06-11 NOTE — Progress Notes (Signed)
S: Samantha Harris presents for her first OB visit.  She is 10w 6d.  This was an unplanned pregnancy.  She is in a relationship with the FOB.  She denies any vaginal bleeding or leakage of fluids.  She complains of some nausea and poor appetite, but no vomiting.  She also complains of some difficulty falling asleep.  Otherwise no complaints  OB History    Grav Para Term Preterm Abortions TAB SAB Ect Mult Living   5 2 2  2     2      # Outc Date GA Lbr Len/2nd Wgt Sex Del Anes PTL Lv   1 TRM 3/04 [redacted]w[redacted]d 24:00 7lb4oz(3.289kg) M SVD EPI  Yes   2 TRM 5/06 [redacted]w[redacted]d 24:00 8lb2oz(3.685kg) M SVD EPI No Yes   3 ABT 8/09 [redacted]w[redacted]d          4 ABT 9/10 [redacted]w[redacted]d          5 CUR              Past Medical History  Diagnosis Date  . HPV in female   . Migraine   . Anxiety    She has a history of abnormal Pap, but most recent Pap was normal (04/2012).   O:  See Flowsheet  A/P: 31 year old G5P2022 @ [redacted]w[redacted]d by LMP (confirmed by first trimester [place]), presents for Initial Prenatal visit:  - Prenatal labs reviewed all WNL except mild anemia, patient advised to take PNV with Iron - Nausea- Rx for zofran, also discussed ginger supplements and small bland meals  - Difficulty sleeping- advised OK to take Benardyl prn for sleep - Genetic testing discussed, patient will think about options, discuss again next visit.  - Bleeding/pain precautions - F/U in 4 weeks, will obtain early glucola at that time for obesity

## 2012-06-11 NOTE — Patient Instructions (Addendum)
You are 10 weeks and 6 days.  Please be sure to take a pre natal vitamin, and you should take an iron supplement too.  I have sent a prescription for zofran for your nausea to the pharmacy, but you can also try Ginger supplements.    If you have abdominal/pelvic pain, or bleeding, please call the office or go to [facility] hospital.

## 2012-06-12 ENCOUNTER — Telehealth: Payer: Self-pay | Admitting: Family Medicine

## 2012-06-12 DIAGNOSIS — E669 Obesity, unspecified: Secondary | ICD-10-CM | POA: Insufficient documentation

## 2012-06-12 DIAGNOSIS — Z3201 Encounter for pregnancy test, result positive: Secondary | ICD-10-CM

## 2012-06-12 NOTE — Telephone Encounter (Signed)
Called patient- let her know Dr. [place] felt she needed to have Glucola earlier than 4 weeks from now.  Scheduled her for lab visit on Friday June 21st at 9:15 am, Glucola ordered.

## 2012-06-15 ENCOUNTER — Other Ambulatory Visit: Payer: Self-pay

## 2012-06-15 NOTE — Progress Notes (Signed)
1 HR GTT DONE TODAY,3 HR GTT SCHEDULE FOR 06/22/12 Mirabelle Cyphers

## 2012-06-22 ENCOUNTER — Other Ambulatory Visit: Payer: Self-pay

## 2012-06-22 DIAGNOSIS — Z331 Pregnant state, incidental: Secondary | ICD-10-CM

## 2012-06-22 LAB — GLUCOSE TOLERANCE, 3 HOURS
Glucose Tolerance, 1 hour: 75 mg/dL (ref 70–189)
Glucose Tolerance, 2 hour: 83 mg/dL (ref 70–164)
Glucose Tolerance, Fasting: 74 mg/dL (ref 70–104)
Glucose, GTT - 3 Hour: 79 mg/dL (ref 70–144)

## 2012-06-22 NOTE — Progress Notes (Signed)
3 HR GTT DONE TODAY Samantha Harris 

## 2012-07-13 ENCOUNTER — Ambulatory Visit (INDEPENDENT_AMBULATORY_CARE_PROVIDER_SITE_OTHER): Payer: Medicaid Other | Admitting: Family Medicine

## 2012-07-13 VITALS — BP 122/78 | Temp 98.4°F | Wt 209.0 lb

## 2012-07-13 DIAGNOSIS — Z331 Pregnant state, incidental: Secondary | ICD-10-CM | POA: Insufficient documentation

## 2012-07-13 NOTE — Patient Instructions (Addendum)
Please call [facility] to ask about breast feeding classes.    Your second blood sugar test was normal- but we will repeat it when you are around 28 weeks.    Please make an appointment for your next check up in 4 weeks in OB clinic.

## 2012-07-13 NOTE — Progress Notes (Signed)
Samantha Harris comes in for OB follow up.  She has had a few headaches and some constipation and wants to know what is safe to take during pregnancy.  Otherwise she is feeling well.  She has not started to feel the baby move yet, but has not had bleeding or vaginal discharge.  She denies any tobacco, alcohol, or drug use since finding out she was pregnant.  She is interested in going to breast feeding classes at [facility].  31 year old G5P2022 @ [redacted]w[redacted]d:  - Obesity- weight is stable- encouraged her to continue healthy diet,  Early 1 hour Glucola was elevated, 3 hour was normal.  Will repeat at 28 weeks.  - Anatomy scan ordered for August 9th - Bleeding/pain precautions reviewed - Advised tylenol ok for headaches, miralax for constipation - Follow up in 4 weeks in [facility] clinic

## 2012-08-02 ENCOUNTER — Encounter: Payer: Self-pay | Admitting: Family Medicine

## 2012-08-02 ENCOUNTER — Ambulatory Visit (INDEPENDENT_AMBULATORY_CARE_PROVIDER_SITE_OTHER): Payer: Medicaid Other | Admitting: Family Medicine

## 2012-08-02 VITALS — BP 110/68 | Wt 211.5 lb

## 2012-08-02 DIAGNOSIS — Z3490 Encounter for supervision of normal pregnancy, unspecified, unspecified trimester: Secondary | ICD-10-CM

## 2012-08-02 DIAGNOSIS — Z331 Pregnant state, incidental: Secondary | ICD-10-CM

## 2012-08-02 NOTE — Progress Notes (Signed)
31 yr old G5P2 @[redacted]w[redacted]d  by LMP, seen today in OB clinic.  Discussed genetic screening, she wishes to have serum quad screen.  Wishes to breast feed.  Has remained abstinent from tobacco and THC for duration of pregnancy. Is feeling quickening, denies ctx, bleeding, discharge.  Anatomy scan scheduled for tomorrow. Patient has plans for independent paternity testing.  Plan to follow up with Dr Lula Olszewski in 4 weeks. She will need repeat 1hrGTT at 26-[redacted] weeks gestation.  Paula Compton, MD

## 2012-08-02 NOTE — Patient Instructions (Addendum)
It was a pleasure to see you today in [facility].  You are at gestational age [redacted]weeks 2 days today.   Please continue to take the Prenatal vitamin and the iron supplement.  (Unable to find combination iron and vitamin).  We are ordering the genetic screening blood test today.   Please follow up with Dr. Lula Olszewski in 4 weeks for your next OB visit.

## 2012-08-03 ENCOUNTER — Ambulatory Visit (HOSPITAL_COMMUNITY)
Admission: RE | Admit: 2012-08-03 | Discharge: 2012-08-03 | Disposition: A | Discharge status: Home / Self Care | Payer: Medicaid Other | Source: Ambulatory Visit | Attending: Family Medicine | Admitting: Family Medicine

## 2012-08-03 DIAGNOSIS — Z3689 Encounter for other specified antenatal screening: Secondary | ICD-10-CM | POA: Insufficient documentation

## 2012-08-03 DIAGNOSIS — E669 Obesity, unspecified: Secondary | ICD-10-CM | POA: Insufficient documentation

## 2012-08-03 DIAGNOSIS — Z331 Pregnant state, incidental: Secondary | ICD-10-CM

## 2012-08-10 ENCOUNTER — Telehealth: Payer: Self-pay | Admitting: Family Medicine

## 2012-08-10 MED ORDER — PRENATAL VITAMINS 0.8 MG PO TABS: 1.0000 | ORAL_TABLET | Freq: Every day | ORAL | Status: AC

## 2012-08-10 NOTE — Telephone Encounter (Signed)
Patient is calling because she asked the MD at her last appt to send an Rx for Prenatal Vitamins that are covered by Medicaid to her pharmacy, but nothing was sent.  She uses [facility] on [address].

## 2012-08-14 ENCOUNTER — Ambulatory Visit (INDEPENDENT_AMBULATORY_CARE_PROVIDER_SITE_OTHER): Payer: Medicaid Other | Admitting: Internal Medicine

## 2012-08-14 ENCOUNTER — Encounter: Payer: Self-pay | Admitting: Internal Medicine

## 2012-08-14 VITALS — BP 100/50 | HR 100 | Ht 66.25 in | Wt 214.0 lb

## 2012-08-14 DIAGNOSIS — Z800 Family history of malignant neoplasm of digestive organs: Secondary | ICD-10-CM

## 2012-08-14 DIAGNOSIS — K5900 Constipation, unspecified: Secondary | ICD-10-CM

## 2012-08-14 NOTE — Progress Notes (Signed)
Samantha Harris [birth year] MRN [id number]   History of Present Illness:  This is a 31 year old African American female in her fifth month of her third pregnancy. She is here to discuss a screening colonoscopy. Her mother had colorectal cancer at the age of 36 and is doing well. Her mother is currently 48 years old. She denies any GI symptoms except for mild constipation caused by taking iron supplements. Her delivery day is in January 2014. She would like to have her colonoscopy in Spring 2014.   Past Medical History  Diagnosis Date  . HPV in female   . Migraine   . Anxiety    Past Surgical History  Procedure Date  . Mouth surgery     reports that she has quit smoking. Her smoking use included Cigarettes. She has never used smokeless tobacco. She reports that she does not drink alcohol or use illicit drugs. family history includes Asthma in her brother; Clotting disorder in her paternal grandmother; Colon cancer (age of onset:35) in her mother; Diabetes in her paternal grandmother and sister; Heart disease in her father; Hypertension in her father; and Stroke in her paternal grandmother. No Known Allergies      Review of Systems: Negative for abdominal pain heartburn blood in stool  The remainder of the 10 point ROS is negative except as outlined in H&P   Physical Exam: General appearance  Well developed, in no distress. Eyes- non icteric. HEENT nontraumatic, normocephalic. Mouth no lesions, tongue papillated, no cheilosis. Neck supple without adenopathy, thyroid not enlarged, no carotid bruits, no JVD. Lungs Clear to auscultation bilaterally. Cor normal S1, normal S2, regular rhythm, no murmur,  quiet precordium. Abdomen: Protuberant abdomen consistent with the [redacted] weeks gestation. Normoactive bowel sounds. No tenderness. Rectal: Not done. Extremities no pedal edema. Skin no lesions. Neurological alert and oriented x 3. Psychological normal mood and affect.  Assessment  and Plan:  Problem #20 31 year old [place] American female with a strong family history of colon cancer in her mother at the age of 45. She will be an appropriate candidate for a screening colonoscopy after she delivers in January 2014. We will send her a letter of recall for March 2014. In the meantime, she will continue her iron supplements and a high-fiber diet.   08/14/2012 Lina Sar

## 2012-08-14 NOTE — Patient Instructions (Addendum)
You will be due for a recall colonoscopy in 02/2013. We will send you a reminder in the mail when it gets closer to that time. CC: Dr Ardyth Gal

## 2012-08-30 ENCOUNTER — Encounter: Payer: Medicaid Other | Admitting: Family Medicine

## 2012-09-05 ENCOUNTER — Ambulatory Visit (INDEPENDENT_AMBULATORY_CARE_PROVIDER_SITE_OTHER): Payer: Medicaid Other | Admitting: Family Medicine

## 2012-09-05 ENCOUNTER — Encounter: Payer: Self-pay | Admitting: Family Medicine

## 2012-09-05 VITALS — BP 114/74 | Wt 217.0 lb

## 2012-09-05 DIAGNOSIS — Z331 Pregnant state, incidental: Secondary | ICD-10-CM

## 2012-09-05 DIAGNOSIS — Z3480 Encounter for supervision of other normal pregnancy, unspecified trimester: Secondary | ICD-10-CM

## 2012-09-05 NOTE — Progress Notes (Signed)
31 year old [id number] @ [redacted]w[redacted]d who presents for routine OB follow up.  She is doing well and has no complaints.  Reports good fetal movement, no bleeding or discharge.   - Plans to breast feed - undecided about contraception, possibly mini pill - Baby is female, wants Circumcision  - Anatomy scan reviewed, no concerns.  - Quad screen negative - Reviewed pre-term labor precautions - reviewed weight gain goals again (has gained 6 lbs since last visit)  - f/u in 4 weeks, will repeat Glucola and draw labs that visit.

## 2012-09-05 NOTE — Patient Instructions (Signed)
It was good to see you.  Please continue to take your vitamins and iron supplement.  At your next visit we will repeat the one hour glucola test and draw lab work.    I will see you in 4 weeks.

## 2012-09-09 ENCOUNTER — Inpatient Hospital Stay (HOSPITAL_COMMUNITY)
Admission: AD | Admit: 2012-09-09 | Discharge: 2012-09-09 | Disposition: A | Discharge status: Home / Self Care | Payer: Medicaid Other | Source: Ambulatory Visit | Attending: Obstetrics and Gynecology | Admitting: Obstetrics and Gynecology

## 2012-09-09 ENCOUNTER — Encounter (HOSPITAL_COMMUNITY): Payer: Self-pay | Admitting: *Deleted

## 2012-09-09 DIAGNOSIS — R109 Unspecified abdominal pain: Secondary | ICD-10-CM | POA: Insufficient documentation

## 2012-09-09 DIAGNOSIS — B3731 Acute candidiasis of vulva and vagina: Secondary | ICD-10-CM | POA: Insufficient documentation

## 2012-09-09 DIAGNOSIS — O212 Late vomiting of pregnancy: Secondary | ICD-10-CM | POA: Insufficient documentation

## 2012-09-09 DIAGNOSIS — O99891 Other specified diseases and conditions complicating pregnancy: Secondary | ICD-10-CM | POA: Insufficient documentation

## 2012-09-09 DIAGNOSIS — K529 Noninfective gastroenteritis and colitis, unspecified: Secondary | ICD-10-CM

## 2012-09-09 DIAGNOSIS — B373 Candidiasis of vulva and vagina: Secondary | ICD-10-CM | POA: Insufficient documentation

## 2012-09-09 DIAGNOSIS — K5289 Other specified noninfective gastroenteritis and colitis: Secondary | ICD-10-CM | POA: Insufficient documentation

## 2012-09-09 DIAGNOSIS — O2390 Unspecified genitourinary tract infection in pregnancy, unspecified trimester: Secondary | ICD-10-CM | POA: Insufficient documentation

## 2012-09-09 LAB — URINALYSIS, ROUTINE W REFLEX MICROSCOPIC
Ketones, ur: NEGATIVE mg/dL
Leukocytes, UA: NEGATIVE
Protein, ur: NEGATIVE mg/dL
Urobilinogen, UA: 0.2 mg/dL (ref 0.0–1.0)

## 2012-09-09 LAB — WET PREP, GENITAL

## 2012-09-09 MED ORDER — FLUCONAZOLE 150 MG PO TABS: 150.0000 mg | ORAL_TABLET | Freq: Once | ORAL | Status: AC

## 2012-09-09 NOTE — MAU Note (Signed)
THINKS MIGHT BE GETTING YEAST INFECTION-  ITCHING STARTED YESTERDAY.

## 2012-09-09 NOTE — MAU Note (Signed)
PT SAYS SHE AWOKE AT 1145PM -  FEELING BAD.  BEFORE WENT TO BED - LOWER BACK WAS HURTING.       WENT TO B-ROOM - VOIDED- THEN  HAD   DIARRHEA-  THEN VOMITED.      ATE AT 9PM-- FISH, SHRIMP , FRIES AND  REESES PIECES.      WHILE IN B-ROOM - FELT ABD TIGHTENING.   DOES NOT FEEL NAUSEA- NOR DIARRHEA NOW  BUT    STILL FEELS CRAMPS.

## 2012-09-09 NOTE — MAU Provider Note (Signed)
History   Samantha Harris is a 31 y.o. [phone] female at [redacted]w[redacted]d who presents with report of eating baked shrimp, fish sticks, french fries and a handful of reeses pieces at app 2130.  At 1150 she developed mid abdominal cramping, nausea w/ vomiting x 1 and 1 episode of diarrhea, w/ none since.  Reports good fm.  Denies LOF or VB.  Does report vaginal itching x 2 days, thinks she may have a yeast infection.  Receives pnc at [facility] by Dr. Lula Olszewski.  Next appt 10/6.    CSN: [id number]  Arrival date and time: 09/09/12 1478   First Provider Initiated Contact with Patient 09/09/12 0159      No chief complaint on file.  HPI  OB History    Grav Para Term Preterm Abortions TAB SAB Ect Mult Living   5 2 2  2 2    2       Past Medical History  Diagnosis Date  . HPV in female   . Migraine   . Anxiety     Past Surgical History  Procedure Date  . Mouth surgery     Family History  Problem Relation Age of Onset  . Colon cancer Mother 78  . Heart disease Father   . Hypertension Father   . Diabetes Sister   . Asthma Brother   . Diabetes Paternal Grandmother   . Stroke Paternal Grandmother   . Clotting disorder Paternal Grandmother     History  Substance Use Topics  . Smoking status: Former Smoker    Types: Cigarettes  . Smokeless tobacco: Never Used  . Alcohol Use: No     before pregnancy    Allergies: No Known Allergies  Prescriptions prior to admission  Medication Sig Dispense Refill  . acetaminophen (TYLENOL) 500 MG tablet Take 500 mg by mouth as needed.      . ferrous sulfate 325 (65 FE) MG tablet Take 325 mg by mouth daily with breakfast.      . Prenatal Multivit-Min-Fe-FA (PRENATAL VITAMINS) 0.8 MG tablet Take 1 tablet by mouth daily.  30 tablet  11    Review of Systems  Constitutional: Negative.  Negative for fever and chills.  HENT: Negative.   Eyes: Negative.   Respiratory: Negative.   Cardiovascular: Negative.   Gastrointestinal: Positive for nausea  (earlier, but denies now), vomiting (earlier x 1, none since), abdominal pain (mid abdominal cramping began at app 1145, right before n/v/d) and diarrhea (x1 earlier, none since).  Genitourinary: Negative for dysuria, urgency, frequency and hematuria.       Vaginal itching x 2 days, no change in d/c, thinks she may have yeast infection  Musculoskeletal: Negative.   Skin: Negative.   Neurological: Negative.   Endo/Heme/Allergies: Negative.   Psychiatric/Behavioral: Negative.    Physical Exam   Height 5\' 6"  (1.676 m), weight 99.428 kg (219 lb 3.2 oz), last menstrual period 03/27/2012.  Physical Exam  Constitutional: She is oriented to person, place, and time. She appears well-developed and well-nourished.  HENT:  Head: Normocephalic.  Neck: Normal range of motion.  Cardiovascular: Normal rate and regular rhythm.   Respiratory: Effort normal.  GI: Soft.       gravid  Genitourinary:       Spec exam: cervix visually closed, thick white clumpy d/c, wet prep obtained  SVE: LTC  Musculoskeletal: Normal range of motion.  Neurological: She is alert and oriented to person, place, and time. She has normal reflexes.  Skin: Skin is warm  and dry.  Psychiatric: She has a normal mood and affect. Her behavior is normal. Judgment and thought content normal.   FHR: 140, mod variability, 10x10accels, no decels= Cat I UCs: mild UI   MAU Course  Procedures  EFM Spec exam w/ wet prep SVE  Results for orders placed during the hospital encounter of 09/09/12 (from the past 24 hour(s))  URINALYSIS, ROUTINE W REFLEX MICROSCOPIC     Status: Normal   Collection Time   09/09/12  1:05 AM      Component Value Range   Color, Urine YELLOW  YELLOW   APPearance CLEAR  CLEAR   Specific Gravity, Urine 1.025  1.005 - 1.030   pH 6.0  5.0 - 8.0   Glucose, UA NEGATIVE  NEGATIVE mg/dL   Hgb urine dipstick NEGATIVE  NEGATIVE   Bilirubin Urine NEGATIVE  NEGATIVE   Ketones, ur NEGATIVE  NEGATIVE mg/dL    Protein, ur NEGATIVE  NEGATIVE mg/dL   Urobilinogen, UA 0.2  0.0 - 1.0 mg/dL   Nitrite NEGATIVE  NEGATIVE   Leukocytes, UA NEGATIVE  NEGATIVE  WET PREP, GENITAL     Status: Abnormal   Collection Time   09/09/12  2:10 AM      Component Value Range   Yeast Wet Prep HPF POC MANY (*) NONE SEEN   Trich, Wet Prep NONE SEEN  NONE SEEN   Clue Cells Wet Prep HPF POC FEW (*) NONE SEEN   WBC, Wet Prep HPF POC MODERATE (*) NONE SEEN    Samantha Harris, Samantha Harris  Home Medication Instructions [id number]   Printed on:09/09/12 0255  Medication Information                    Prenatal Multivit-Min-Fe-FA (PRENATAL VITAMINS) 0.8 MG tablet Take 1 tablet by mouth daily.           ferrous sulfate 325 (65 FE) MG tablet Take 325 mg by mouth daily with breakfast.           acetaminophen (TYLENOL) 500 MG tablet Take 500 mg by mouth as needed.           fluconazole (DIFLUCAN) 150 MG tablet Take 1 tablet (150 mg total) by mouth once.            Follow-up Information    Follow up with [facility]- Dr. Lula Olszewski. (as scheduled on 10/6 or sooner if you feel like you need to be seen.  Return the hospital  as needed or  if symptoms worsen)          Assessment and Plan  A:  [redacted]w[redacted]d SIUP  Probable gastroenteritis r/t food poisoning  Vulvovaginal candidiasis    P:  D/C home  Rx: Diflucan 150mg  po x 1, may repeat in 3days if needed  Keep appt as scheduled w/ Dr. Lula Olszewski on 10/6  Stay hydrated  Return to hospital prn  Marge Duncans 09/09/2012, 2:00 AM

## 2012-09-10 NOTE — MAU Provider Note (Signed)
Agree with above note.  Samantha Harris 09/10/2012 2:18 PM   

## 2012-09-13 ENCOUNTER — Ambulatory Visit: Payer: Medicaid Other | Admitting: Family Medicine

## 2012-09-26 ENCOUNTER — Ambulatory Visit (INDEPENDENT_AMBULATORY_CARE_PROVIDER_SITE_OTHER): Payer: Medicaid Other | Admitting: Family Medicine

## 2012-09-26 VITALS — BP 134/75 | Temp 97.4°F | Wt 222.0 lb

## 2012-09-26 DIAGNOSIS — Z331 Pregnant state, incidental: Secondary | ICD-10-CM

## 2012-09-26 DIAGNOSIS — Z3480 Encounter for supervision of other normal pregnancy, unspecified trimester: Secondary | ICD-10-CM

## 2012-09-26 LAB — GLUCOSE, CAPILLARY
Comment 1: 1
Glucose-Capillary: 160 mg/dL — ABNORMAL HIGH (ref 70–99)

## 2012-09-26 NOTE — Patient Instructions (Signed)
It was good to see you.  You are 26 weeks and 1 day along.  Please be sure to keep your appointment tomorrow for your 3-hour glucola.    If you want to make a nutrition visit- please ask the front desk to schedule you any time.  Please make your next visit for about 3 weeks, after that we will see you ever two weeks.

## 2012-09-26 NOTE — Progress Notes (Signed)
31 year old [id number] @ [redacted]w[redacted]d presents for OB follow up.  She is feeling well, denies any vaginal bleeding, discharge, or contractions.  Reports good Fetal movement.  - Failed 1 hour glucola, scheduled for 3 hour tomorrow - Initial BP elevated, repeat was 122/74. Will monitor closely.  - Reviewed weight gain goals, fetal kick counts, preterm labor precautions.  - Will draw HIV, RPR, and CBC with glucola tomorrow.  - plans to breast feed, still not sure about contraception, wants baby to have circumcision.  - f/u in 2-3 weeks.

## 2012-09-27 ENCOUNTER — Other Ambulatory Visit: Payer: Medicaid Other

## 2012-09-27 DIAGNOSIS — Z331 Pregnant state, incidental: Secondary | ICD-10-CM

## 2012-09-27 LAB — CBC
HCT: 32.3 % — ABNORMAL LOW (ref 36.0–46.0)
Hemoglobin: 10.1 g/dL — ABNORMAL LOW (ref 12.0–15.0)
MCH: 23.6 pg — ABNORMAL LOW (ref 26.0–34.0)
MCHC: 31.3 g/dL (ref 30.0–36.0)
RDW: 14.9 % (ref 11.5–15.5)

## 2012-09-27 NOTE — Progress Notes (Signed)
3 HR GTT AND 28 WEEK LABS DONE TODAY Samantha Harris

## 2012-09-28 LAB — RPR

## 2012-09-28 LAB — GLUCOSE TOLERANCE, 3 HOURS
Glucose Tolerance, 2 hour: 121 mg/dL (ref 70–164)
Glucose Tolerance, Fasting: 75 mg/dL (ref 70–104)
Glucose, GTT - 3 Hour: 95 mg/dL (ref 70–144)

## 2012-10-01 ENCOUNTER — Encounter: Payer: Self-pay | Admitting: Family Medicine

## 2012-10-08 ENCOUNTER — Telehealth: Payer: Self-pay | Admitting: *Deleted

## 2012-10-08 NOTE — Telephone Encounter (Signed)
Patient completed form to request PCP change.  Form states "My concern is that from day 1 I haven't really felt a doctor/patient relationship with her.  I remained this long with her hoping it may change, but has not.  Experiencing assistance with other doctors in [facility] I feel as if the visit/experience was a much better and genuine one.  She may work for someone else, but I would like to request another female doctor please and if none available, I will just accept a female.  I have already switched my remaining time of pregnancy elsewhere due to the above but will like to ensure a more positive experience for myself for regular visits after arrival of child."    Called patient for additional info.  Patient has transferred her OB care to [facility] and has appt for 10/17/12.  Patient's children are not patients here at [facility].  Explained PCP change process.  Will route note to Dr. Lula Olszewski and Dr. Pascal Lux.  Will follow-up with patient next week.

## 2012-10-17 NOTE — Telephone Encounter (Signed)
I have tried to call the patient several times, with no answer.  Her phone may be disconnected.  Unfortunately I had no indication that Ms. Samantha Harris was unhappy with her care as she never expressed any discontent.  I think it is very important for her to feel comfortable with her doctor- both FM and OBGYN.  If she calls back please get a working phone number as I would like to speak with her so I can learn how to improve.

## 2012-10-18 ENCOUNTER — Encounter: Payer: Medicaid Other | Admitting: Family Medicine

## 2012-12-26 NOTE — L&D Delivery Note (Signed)
Patient was C/C/+2 and pushed for approx 10 minutes with epidural.  Thick meconium, mild shoulder dystocia resolved with McRoberts and suprapubic pressure, NSVD female infant, Apgars 9/9, weight 8#1.   The patient had nolacerations. Fundus was firm. EBL was expected. Placenta was delivered intact. Vagina was clear.  Baby was vigorous to bedside.  Samantha Harris

## 2012-12-27 ENCOUNTER — Inpatient Hospital Stay (HOSPITAL_COMMUNITY)
Admission: AD | Admit: 2012-12-27 | Discharge: 2012-12-29 | DRG: 775 | Disposition: A | Discharge status: Home / Self Care | Payer: Medicaid Other | Source: Ambulatory Visit | Attending: Obstetrics and Gynecology | Admitting: Obstetrics and Gynecology

## 2012-12-27 ENCOUNTER — Encounter (HOSPITAL_COMMUNITY): Payer: Self-pay | Admitting: Anesthesiology

## 2012-12-27 ENCOUNTER — Encounter (HOSPITAL_COMMUNITY): Payer: Self-pay | Admitting: *Deleted

## 2012-12-27 DIAGNOSIS — E669 Obesity, unspecified: Secondary | ICD-10-CM | POA: Diagnosis present

## 2012-12-27 LAB — TYPE AND SCREEN: ABO/RH(D): O POS

## 2012-12-27 LAB — CBC
Hemoglobin: 11.3 g/dL — ABNORMAL LOW (ref 12.0–15.0)
MCHC: 32 g/dL (ref 30.0–36.0)
Platelets: 308 10*3/uL (ref 150–400)
RBC: 4.72 MIL/uL (ref 3.87–5.11)

## 2012-12-27 LAB — ABO/RH: ABO/RH(D): O POS

## 2012-12-27 LAB — RPR: RPR Ser Ql: NONREACTIVE

## 2012-12-27 MED ORDER — OXYCODONE-ACETAMINOPHEN 5-325 MG PO TABS: 1.0000 | ORAL_TABLET | ORAL | Status: DC | PRN

## 2012-12-27 MED ORDER — DIPHENHYDRAMINE HCL 50 MG/ML IJ SOLN: 12.5000 mg | INTRAMUSCULAR | Status: DC | PRN

## 2012-12-27 MED ORDER — ONDANSETRON HCL 4 MG/2ML IJ SOLN: 4.0000 mg | INTRAMUSCULAR | Status: DC | PRN

## 2012-12-27 MED ORDER — OXYTOCIN BOLUS FROM INFUSION: 500.0000 mL | INTRAVENOUS | Status: DC

## 2012-12-27 MED ORDER — DIBUCAINE 1 % RE OINT: 1.0000 "application " | TOPICAL_OINTMENT | RECTAL | Status: DC | PRN

## 2012-12-27 MED ORDER — LACTATED RINGERS IV SOLN
500.0000 mL | Freq: Once | INTRAVENOUS | Status: AC
  Administered 2012-12-27: 300 mL via INTRAVENOUS

## 2012-12-27 MED ORDER — IBUPROFEN 600 MG PO TABS: 600.0000 mg | ORAL_TABLET | Freq: Four times a day (QID) | ORAL | Status: DC | PRN

## 2012-12-27 MED ORDER — PHENYLEPHRINE 40 MCG/ML (10ML) SYRINGE FOR IV PUSH (FOR BLOOD PRESSURE SUPPORT): 80.0000 ug | PREFILLED_SYRINGE | INTRAVENOUS | Status: DC | PRN

## 2012-12-27 MED ORDER — FLEET ENEMA 7-19 GM/118ML RE ENEM: 1.0000 | ENEMA | RECTAL | Status: DC | PRN

## 2012-12-27 MED ORDER — ONDANSETRON HCL 4 MG PO TABS: 4.0000 mg | ORAL_TABLET | ORAL | Status: DC | PRN

## 2012-12-27 MED ORDER — CITRIC ACID-SODIUM CITRATE 334-500 MG/5ML PO SOLN: 30.0000 mL | ORAL | Status: DC | PRN

## 2012-12-27 MED ORDER — DIPHENHYDRAMINE HCL 25 MG PO CAPS: 25.0000 mg | ORAL_CAPSULE | Freq: Four times a day (QID) | ORAL | Status: DC | PRN

## 2012-12-27 MED ORDER — PHENYLEPHRINE 40 MCG/ML (10ML) SYRINGE FOR IV PUSH (FOR BLOOD PRESSURE SUPPORT)
80.0000 ug | PREFILLED_SYRINGE | INTRAVENOUS | Status: DC | PRN
  Filled 2012-12-27: qty 5

## 2012-12-27 MED ORDER — TERBUTALINE SULFATE 1 MG/ML IJ SOLN: 0.2500 mg | Freq: Once | INTRAMUSCULAR | Status: DC | PRN

## 2012-12-27 MED ORDER — EPHEDRINE 5 MG/ML INJ: 10.0000 mg | INTRAVENOUS | Status: DC | PRN

## 2012-12-27 MED ORDER — WITCH HAZEL-GLYCERIN EX PADS: 1.0000 "application " | MEDICATED_PAD | CUTANEOUS | Status: DC | PRN

## 2012-12-27 MED ORDER — TETANUS-DIPHTH-ACELL PERTUSSIS 5-2.5-18.5 LF-MCG/0.5 IM SUSP: 0.5000 mL | Freq: Once | INTRAMUSCULAR | Status: DC

## 2012-12-27 MED ORDER — ACETAMINOPHEN 325 MG PO TABS: 650.0000 mg | ORAL_TABLET | ORAL | Status: DC | PRN

## 2012-12-27 MED ORDER — ONDANSETRON HCL 4 MG/2ML IJ SOLN: 4.0000 mg | Freq: Four times a day (QID) | INTRAMUSCULAR | Status: DC | PRN

## 2012-12-27 MED ORDER — SIMETHICONE 80 MG PO CHEW: 80.0000 mg | CHEWABLE_TABLET | ORAL | Status: DC | PRN

## 2012-12-27 MED ORDER — ZOLPIDEM TARTRATE 5 MG PO TABS: 5.0000 mg | ORAL_TABLET | Freq: Every evening | ORAL | Status: DC | PRN

## 2012-12-27 MED ORDER — OXYTOCIN 40 UNITS IN LACTATED RINGERS INFUSION - SIMPLE MED: 62.5000 mL/h | INTRAVENOUS | Status: DC

## 2012-12-27 MED ORDER — OXYTOCIN 40 UNITS IN LACTATED RINGERS INFUSION - SIMPLE MED
1.0000 m[IU]/min | INTRAVENOUS | Status: DC
  Administered 2012-12-27: 666 m[IU]/min via INTRAVENOUS
  Filled 2012-12-27: qty 1000

## 2012-12-27 MED ORDER — IBUPROFEN 600 MG PO TABS
600.0000 mg | ORAL_TABLET | Freq: Four times a day (QID) | ORAL | Status: DC
  Administered 2012-12-27 – 2012-12-29 (×9): 600 mg via ORAL
  Filled 2012-12-27 (×9): qty 1

## 2012-12-27 MED ORDER — SENNOSIDES-DOCUSATE SODIUM 8.6-50 MG PO TABS
2.0000 | ORAL_TABLET | Freq: Every day | ORAL | Status: DC
  Administered 2012-12-27 – 2012-12-28 (×2): 2 via ORAL

## 2012-12-27 MED ORDER — EPHEDRINE 5 MG/ML INJ
10.0000 mg | INTRAVENOUS | Status: DC | PRN
  Filled 2012-12-27: qty 4

## 2012-12-27 MED ORDER — LANOLIN HYDROUS EX OINT: TOPICAL_OINTMENT | CUTANEOUS | Status: DC | PRN

## 2012-12-27 MED ORDER — LACTATED RINGERS IV SOLN
INTRAVENOUS | Status: DC
  Administered 2012-12-27 (×2): via INTRAVENOUS

## 2012-12-27 MED ORDER — BENZOCAINE-MENTHOL 20-0.5 % EX AERO: 1.0000 "application " | INHALATION_SPRAY | CUTANEOUS | Status: DC | PRN

## 2012-12-27 MED ORDER — FENTANYL 2.5 MCG/ML BUPIVACAINE 1/10 % EPIDURAL INFUSION (WH - ANES)
14.0000 mL/h | INTRAMUSCULAR | Status: DC
  Administered 2012-12-27: 14 mL/h via EPIDURAL
  Filled 2012-12-27 (×2): qty 125

## 2012-12-27 MED ORDER — LIDOCAINE HCL (PF) 1 % IJ SOLN
30.0000 mL | INTRAMUSCULAR | Status: DC | PRN
  Filled 2012-12-27: qty 30

## 2012-12-27 MED ORDER — PRENATAL MULTIVITAMIN CH
1.0000 | ORAL_TABLET | Freq: Every day | ORAL | Status: DC
  Administered 2012-12-27 – 2012-12-29 (×3): 1 via ORAL
  Filled 2012-12-27 (×3): qty 1

## 2012-12-27 NOTE — H&P (Signed)
32 y.o. [redacted]w[redacted]d  [id number] comes in c/o ctx.  Otherwise has good fetal movement and no bleeding.  No other complaints.  Past Medical History  Diagnosis Date  . HPV in female   . Migraine   . Anxiety     Past Surgical History  Procedure Date  . Mouth surgery     OB History    Grav Para Term Preterm Abortions TAB SAB Ect Mult Living   5 2 2  2 2    2      # Outc Date GA Lbr Len/2nd Wgt Sex Del Anes PTL Lv   1 TRM 3/04 [redacted]w[redacted]d 24:00 7lb4oz(3.289kg) M SVD EPI  Yes   2 TRM 5/06 [redacted]w[redacted]d 24:00 8lb2oz(3.685kg) M SVD EPI No Yes   3 TAB 8/09 [redacted]w[redacted]d          4 TAB 9/10 [redacted]w[redacted]d          5 CUR               History   Social History  . Marital Status: Single    Spouse Name: N/A    Number of Children: 2  . Years of Education: N/A   Occupational History  . Not on file.   Social History Main Topics  . Smoking status: Former Smoker    Types: Cigarettes  . Smokeless tobacco: Never Used  . Alcohol Use: No     Comment: before pregnancy  . Drug Use: No     Comment: past marijuana  . Sexually Active: Yes   Other Topics Concern  . Not on file   Social History Narrative  . No narrative on file   Review of patient's allergies indicates no known allergies.    Prenatal Transfer Tool  Maternal Diabetes: No Genetic Screening: none documented in chart of late transfer of care, all prior care states uncomplicated Fetal Ultrasounds or other Referrals: No abnormalities noted Maternal Substance Abuse:  No Significant Maternal Medications:  None Significant Maternal Lab Results: None  Other PNC: none, morbid obesity    Filed Vitals:   12/27/12 1001  BP: 125/56  Pulse: 100  Temp: 98.2 F (36.8 C)  Resp: 20     Lungs/Cor:  NAD Abdomen:  soft, gravid Ex:  no cords, erythema SVE:  6.5/70/-2 no change my exam FHTs:  135, periods of good STV, no decels Toco:  q1-5   A/P   Admit in labor AROM'd - meconium, pitocin as needed for augmentation Other routine care  GBS Neg  Ercelle Winkles,  Jaylun Fleener

## 2012-12-27 NOTE — Progress Notes (Signed)
Provider notified of minimal variability, disfunctional uc pattern, sve.  No new orders at this time.

## 2012-12-27 NOTE — MAU Note (Signed)
Pt states she started having contractions that are 5-7 mins after about 2200

## 2012-12-27 NOTE — Anesthesia Preprocedure Evaluation (Signed)
Anesthesia Evaluation  Patient identified by MRN, date of birth, ID band Patient awake    Reviewed: Allergy & Precautions, H&P , Patient's Chart, lab work & pertinent test results  Airway Mallampati: III TM Distance: >3 FB Neck ROM: full    Dental No notable dental hx.    Pulmonary neg pulmonary ROS,  breath sounds clear to auscultation  Pulmonary exam normal       Cardiovascular negative cardio ROS  Rhythm:regular Rate:Normal     Neuro/Psych  Headaches, PSYCHIATRIC DISORDERS Anxiety negative neurological ROS  negative psych ROS   GI/Hepatic negative GI ROS, Neg liver ROS,   Endo/Other  negative endocrine ROS  Renal/GU negative Renal ROS     Musculoskeletal   Abdominal   Peds  Hematology negative hematology ROS (+)   Anesthesia Other Findings   Reproductive/Obstetrics (+) Pregnancy                           Anesthesia Physical Anesthesia Plan  ASA: II  Anesthesia Plan: Epidural   Post-op Pain Management:    Induction:   Airway Management Planned:   Additional Equipment:   Intra-op Plan:   Post-operative Plan:   Informed Consent: I have reviewed the patients History and Physical, chart, labs and discussed the procedure including the risks, benefits and alternatives for the proposed anesthesia with the patient or authorized representative who has indicated his/her understanding and acceptance.     Plan Discussed with:   Anesthesia Plan Comments:         Anesthesia Quick Evaluation

## 2012-12-28 LAB — CBC
Hemoglobin: 9.1 g/dL — ABNORMAL LOW (ref 12.0–15.0)
MCHC: 31.4 g/dL (ref 30.0–36.0)
RBC: 3.85 MIL/uL — ABNORMAL LOW (ref 3.87–5.11)

## 2012-12-28 NOTE — Progress Notes (Signed)
Patient is eating, ambulating, voiding.  Pain control is good.  Filed Vitals:   12/27/12 1420 12/27/12 1805 12/28/12 0235 12/28/12 0655  BP: 123/66 114/75 103/67 105/69  Pulse: 106 86 85 81  Temp: 99.1 F (37.3 C) 98.7 F (37.1 C) 97.8 F (36.6 C) 98 F (36.7 C)  TempSrc: Oral Oral Oral Oral  Resp: 20 20 20 20   Height:      Weight:      SpO2:   99%     Fundus firm Perineum without swelling.  Lab Results  Component Value Date   WBC 14.0* 12/28/2012   HGB 9.1* 12/28/2012   HCT 29.0* 12/28/2012   MCV 75.3* 12/28/2012   PLT 288 12/28/2012    --/--/O POS, O POS (01/02 0305)/RI  A/P Post partum day 1.  Routine care.  Expect d/c tomorrow.    Maxmilian Trostel A

## 2012-12-28 NOTE — Clinical Social Work Maternal (Signed)
    Clinical Social Work Department PSYCHOSOCIAL ASSESSMENT - MATERNAL/CHILD 12/28/2012  Patient:  Samantha Harris, Samantha Harris  Account Number:  [id number]  Admit Date:  12/27/2012  Samantha Harris Name:   Samantha Harris    Clinical Social Worker:  Samantha Riding, LCSW   Date/Time:  12/28/2012 01:00 PM  Date Referred:  12/28/2012   Referral source  CN     Referred reason  Depression/Anxiety   Other referral source:    I:  FAMILY / HOME ENVIRONMENT Child's legal guardian:  PARENT  Guardian - Name Guardian - Age Guardian - Address  Samantha Harris [address] [place], [place] [address]  Samantha Harris  Does not live with [name]   Other household support members/support persons Name Relationship DOB  Samantha Harris BROTHER 9  Samantha Harris BROTHER 7   Other support:   Good support system of family and friends    II  PSYCHOSOCIAL DATA Information Source:  Patient Interview  Event organiser Employment:   Surveyor, quantity resources:  [employer] If Medicaid - County:  [place] Other  WIC   School / Grade:   Maternity Care Coordinator / Child Services Coordination / Early Interventions:  Cultural issues impacting care:   none identified    III  STRENGTHS Strengths  Adequate Resources  Compliance with medical plan  Home prepared for Child (including basic supplies)  Other - See comment  Supportive family/friends   Strength comment:  Pediatrician is Samantha Harris   IV  RISK FACTORS AND CURRENT PROBLEMS Current Problem:  None     V  SOCIAL WORK ASSESSMENT CSW met with [name] in her first floor room/143 to complete assessment for Anxiety.  [name] was very pleasant and state she and baby are doing well.  She reports having a large support system and explained that she and FOB have a great relationship although they are not actually "an item" at this time.  She states this is her 3rd child and his second, but their first together.  When CSW asked about her hx of Anxiety, she  explained that she has phobias such as crowds and elevators, but that she does not have to take medication.  She states she has learned coping strategies to control these symptoms.  She states she took Buspar in the past briefly, but did not like the way it made her feel.  She states she feels great emotionally at this time and has no concerns in this area.  She denies any past PPD. We discussed signs and symptoms to watch for and she states she feels comfortable with her doctor if symptoms arise. CSW has no further concerns at this time.      VI SOCIAL WORK PLAN Social Work Plan  No Further Intervention Required / No Barriers to Discharge   Type of pt/family education:   PPD signs and symptoms   If child protective services report - county:   If child protective services report - date:   Information/referral to community resources comment:   No needs identified.   Other social work plan:

## 2012-12-28 NOTE — Discharge Summary (Addendum)
Obstetric Discharge Summary Reason for Admission: onset of labor Prenatal Procedures: none Intrapartum Procedures: spontaneous vaginal delivery Postpartum Procedures: none Complications-Operative and Postpartum: none Hemoglobin  Date Value Range Status  12/28/2012 9.1* 12.0 - 15.0 g/dL Final     DELTA CHECK NOTED     REPEATED TO VERIFY     HCT  Date Value Range Status  12/28/2012 29.0* 36.0 - 46.0 % Final    Discharge Diagnoses: Term Pregnancy-delivered  Discharge Information: Date: 12/28/2012 Activity: pelvic rest Diet: routine Medications: Ibuprofen, iron otc Condition: stable Instructions: refer to practice specific booklet Discharge to: home Follow-up Information    Follow up with CALLAHAN, SIDNEY, DO. Schedule an appointment as soon as possible for a visit in 4 weeks.   Contact information:   [address] [place] [address] [phone]          Newborn Data: Live born female  Birth Weight: 8 lb 1.3 oz (3665 g) APGAR: 9, 9  Home with mother.  Mykah Shin A 12/28/2012, 8:04 AM

## 2012-12-28 NOTE — Progress Notes (Signed)
Pt desires circ to be done in office.

## 2012-12-28 NOTE — Anesthesia Postprocedure Evaluation (Signed)
Anesthesia Post Note  Patient: Samantha Harris  Procedure(s) Performed: * No surgery found *  Anesthesia type: Epidural  Patient location: Mother/Baby  Post pain: Pain level controlled  Post assessment: Post-op Vital signs reviewed  Last Vitals:  Filed Vitals:   12/28/12 0655  BP: 105/69  Pulse: 81  Temp: 36.7 C  Resp: 20    Post vital signs: Reviewed  Level of consciousness:alert  Complications: No apparent anesthesia complications

## 2012-12-29 NOTE — Progress Notes (Signed)
Patient is eating, ambulating, voiding.  Pain control is good.  Filed Vitals:   12/28/12 0655 12/28/12 1418 12/28/12 2208 12/29/12 0659  BP: 105/69 125/74 101/68 126/73  Pulse: 81 92 81 88  Temp: 98 F (36.7 C) 98.1 F (36.7 C) 98 F (36.7 C) 98 F (36.7 C)  TempSrc: Oral Oral Oral Oral  Resp: 20 20 18 16   Height:      Weight:      SpO2:        Fundus firm Perineum without swelling.  Lab Results  Component Value Date   WBC 14.0* 12/28/2012   HGB 9.1* 12/28/2012   HCT 29.0* 12/28/2012   MCV 75.3* 12/28/2012   PLT 288 12/28/2012    --/--/O POS, O POS (01/02 0305)/RI  A/P Post partum day 2.  Routine care.  Expect d/c today.    Delrose Rohwer A

## 2013-01-03 ENCOUNTER — Emergency Department (HOSPITAL_BASED_OUTPATIENT_CLINIC_OR_DEPARTMENT_OTHER)
Admission: EM | Admit: 2013-01-03 | Discharge: 2013-01-03 | Disposition: A | Discharge status: Home / Self Care | Payer: Medicaid Other | Attending: Emergency Medicine | Admitting: Emergency Medicine

## 2013-01-03 ENCOUNTER — Encounter (HOSPITAL_BASED_OUTPATIENT_CLINIC_OR_DEPARTMENT_OTHER): Payer: Self-pay | Admitting: Emergency Medicine

## 2013-01-03 DIAGNOSIS — Z8659 Personal history of other mental and behavioral disorders: Secondary | ICD-10-CM | POA: Insufficient documentation

## 2013-01-03 DIAGNOSIS — Z8679 Personal history of other diseases of the circulatory system: Secondary | ICD-10-CM | POA: Insufficient documentation

## 2013-01-03 DIAGNOSIS — J069 Acute upper respiratory infection, unspecified: Secondary | ICD-10-CM | POA: Insufficient documentation

## 2013-01-03 DIAGNOSIS — Z87891 Personal history of nicotine dependence: Secondary | ICD-10-CM | POA: Insufficient documentation

## 2013-01-03 DIAGNOSIS — R05 Cough: Secondary | ICD-10-CM | POA: Insufficient documentation

## 2013-01-03 DIAGNOSIS — R059 Cough, unspecified: Secondary | ICD-10-CM | POA: Insufficient documentation

## 2013-01-03 DIAGNOSIS — Z8619 Personal history of other infectious and parasitic diseases: Secondary | ICD-10-CM | POA: Insufficient documentation

## 2013-01-03 NOTE — ED Notes (Signed)
Pt is post partum from 12/27/12 and is breast feeding.

## 2013-01-03 NOTE — ED Notes (Signed)
MD at bedside. 

## 2013-01-03 NOTE — ED Notes (Signed)
Pt c/o cough, runny nose, and congestion x 2-3 days.

## 2013-01-03 NOTE — ED Provider Notes (Signed)
History     CSN: [id number]  Arrival date & time 01/03/13  0113   First MD Initiated Contact with Patient 01/03/13 0139      Chief Complaint  Patient presents with  . URI    (Consider location/radiation/quality/duration/timing/severity/associated sxs/prior treatment) HPI Comments: Patient presents with uri-like symptoms for two days.  She recently had a baby and is breast-feeding.  Patient is a 32 y.o. female presenting with URI. The history is provided by the patient.  URI The primary symptoms include ear pain and cough. Primary symptoms comment: congestion The current episode started 2 days ago. This is a new problem. The problem has been gradually worsening.  Symptoms associated with the illness include congestion. The illness is not associated with chills or sinus pressure.    Past Medical History  Diagnosis Date  . HPV in female   . Migraine   . Anxiety     Past Surgical History  Procedure Date  . Mouth surgery     Family History  Problem Relation Age of Onset  . Colon cancer Mother 67  . Heart disease Father   . Hypertension Father   . Diabetes Sister   . Asthma Brother   . Diabetes Paternal Grandmother   . Stroke Paternal Grandmother   . Clotting disorder Paternal Grandmother     History  Substance Use Topics  . Smoking status: Former Smoker    Types: Cigarettes  . Smokeless tobacco: Never Used  . Alcohol Use: No     Comment: before pregnancy    OB History    Grav Para Term Preterm Abortions TAB SAB Ect Mult Living   5 3 3  2 2    3       Review of Systems  Constitutional: Negative for chills.  HENT: Positive for ear pain and congestion. Negative for sinus pressure.   Respiratory: Positive for cough.   All other systems reviewed and are negative.    Allergies  Review of patient's allergies indicates no known allergies.  Home Medications   Current Outpatient Rx  Name  Route  Sig  Dispense  Refill  . ACETAMINOPHEN 500 MG PO TABS    Oral   Take 500 mg by mouth as needed.         Marland Kitchen FERROUS SULFATE 325 (65 FE) MG PO TABS   Oral   Take 325 mg by mouth daily with breakfast.         . PRENATAL VITAMINS 0.8 MG PO TABS   Oral   Take 1 tablet by mouth daily.   30 tablet   11     BP 119/57  Pulse 71  Temp 98.3 F (36.8 C) (Oral)  Resp 18  Ht 5\' 6"  (1.676 m)  Wt 240 lb (108.863 kg)  BMI 38.74 kg/m2  SpO2 100%  LMP 03/27/2012  Breastfeeding? Unknown  Physical Exam  Nursing note and vitals reviewed. Constitutional: She is oriented to person, place, and time. She appears well-developed and well-nourished. No distress.  HENT:  Head: Normocephalic and atraumatic.  Mouth/Throat: Oropharynx is clear and moist.       TM's clear bilaterally.  Neck: Normal range of motion. Neck supple.  Cardiovascular: Normal rate and regular rhythm.  Exam reveals no gallop and no friction rub.   No murmur heard. Pulmonary/Chest: Effort normal and breath sounds normal. No respiratory distress. She has no wheezes.  Abdominal: Soft. Bowel sounds are normal. She exhibits no distension. There is no tenderness.  Musculoskeletal: Normal  range of motion.  Neurological: She is alert and oriented to person, place, and time.  Skin: Skin is warm and dry. She is not diaphoretic.    ED Course  Procedures (including critical care time)  Labs Reviewed - No data to display No results found.   No diagnosis found.    MDM  Symptoms are viral in nature.  She was advised to take tylenol, fluids, rest.  Advised against decongestants, cough syrups as she is breast feeding and will be passed on the baby.        Geoffery Lyons, MD 01/03/13 [phone]

## 2013-03-19 ENCOUNTER — Encounter: Payer: Self-pay | Admitting: Internal Medicine

## 2014-04-02 ENCOUNTER — Emergency Department (HOSPITAL_BASED_OUTPATIENT_CLINIC_OR_DEPARTMENT_OTHER)
Admission: EM | Admit: 2014-04-02 | Discharge: 2014-04-02 | Disposition: A | Discharge status: Home / Self Care | Payer: Medicaid Other | Attending: Emergency Medicine | Admitting: Emergency Medicine

## 2014-04-02 ENCOUNTER — Encounter (HOSPITAL_BASED_OUTPATIENT_CLINIC_OR_DEPARTMENT_OTHER): Payer: Self-pay | Admitting: Emergency Medicine

## 2014-04-02 DIAGNOSIS — Z79899 Other long term (current) drug therapy: Secondary | ICD-10-CM | POA: Insufficient documentation

## 2014-04-02 DIAGNOSIS — Z8679 Personal history of other diseases of the circulatory system: Secondary | ICD-10-CM | POA: Insufficient documentation

## 2014-04-02 DIAGNOSIS — Z3202 Encounter for pregnancy test, result negative: Secondary | ICD-10-CM | POA: Insufficient documentation

## 2014-04-02 DIAGNOSIS — N760 Acute vaginitis: Secondary | ICD-10-CM | POA: Insufficient documentation

## 2014-04-02 DIAGNOSIS — B9689 Other specified bacterial agents as the cause of diseases classified elsewhere: Secondary | ICD-10-CM | POA: Insufficient documentation

## 2014-04-02 DIAGNOSIS — Z8659 Personal history of other mental and behavioral disorders: Secondary | ICD-10-CM | POA: Insufficient documentation

## 2014-04-02 DIAGNOSIS — A499 Bacterial infection, unspecified: Secondary | ICD-10-CM | POA: Insufficient documentation

## 2014-04-02 DIAGNOSIS — Z87891 Personal history of nicotine dependence: Secondary | ICD-10-CM | POA: Insufficient documentation

## 2014-04-02 LAB — URINALYSIS, ROUTINE W REFLEX MICROSCOPIC
BILIRUBIN URINE: NEGATIVE
Glucose, UA: NEGATIVE mg/dL
HGB URINE DIPSTICK: NEGATIVE
KETONES UR: NEGATIVE mg/dL
Leukocytes, UA: NEGATIVE
NITRITE: NEGATIVE
Protein, ur: NEGATIVE mg/dL
SPECIFIC GRAVITY, URINE: 1.025 (ref 1.005–1.030)
UROBILINOGEN UA: 1 mg/dL (ref 0.0–1.0)
pH: 7 (ref 5.0–8.0)

## 2014-04-02 LAB — WET PREP, GENITAL
TRICH WET PREP: NONE SEEN
WBC WET PREP: NONE SEEN
YEAST WET PREP: NONE SEEN

## 2014-04-02 LAB — PREGNANCY, URINE: Preg Test, Ur: NEGATIVE

## 2014-04-02 MED ORDER — METRONIDAZOLE 0.75 % VA GEL: 1.0000 | Freq: Two times a day (BID) | VAGINAL | Status: DC

## 2014-04-02 NOTE — ED Provider Notes (Signed)
CSN: [id number]     Arrival date & time 04/02/14  1920 History   First MD Initiated Contact with Patient 04/02/14 1927     Chief Complaint  Patient presents with  . Vaginal Discharge     (Consider location/radiation/quality/duration/timing/severity/associated sxs/prior Treatment) Patient is a 33 y.o. female presenting with vaginal discharge. The history is provided by the patient. No language interpreter was used.  Vaginal Discharge Quality:  White Severity:  Moderate Onset quality:  Gradual Duration:  1 week Timing:  Constant Progression:  Worsening Chronicity:  New Relieved by:  Nothing Worsened by:  Nothing tried Ineffective treatments:  None tried Associated symptoms: no fever   Risk factors: no STI exposure     Past Medical History  Diagnosis Date  . HPV in female   . Migraine   . Anxiety    Past Surgical History  Procedure Laterality Date  . Mouth surgery     Family History  Problem Relation Age of Onset  . Colon cancer Mother 7  . Heart disease Father   . Hypertension Father   . Diabetes Sister   . Asthma Brother   . Diabetes Paternal Grandmother   . Stroke Paternal Grandmother   . Clotting disorder Paternal Grandmother    History  Substance Use Topics  . Smoking status: Former Smoker    Types: Cigarettes  . Smokeless tobacco: Never Used  . Alcohol Use: No     Comment: before pregnancy   OB History   Grav Para Term Preterm Abortions TAB SAB Ect Mult Living   5 3 3  2 2    3      Review of Systems  Constitutional: Negative for fever.  Genitourinary: Positive for vaginal discharge.  All other systems reviewed and are negative.     Allergies  Review of patient's allergies indicates no known allergies.  Home Medications   Current Outpatient Rx  Name  Route  Sig  Dispense  Refill  . acetaminophen (TYLENOL) 500 MG tablet   Oral   Take 500 mg by mouth as needed.         . ferrous sulfate 325 (65 FE) MG tablet   Oral   Take 325 mg by  mouth daily with breakfast.          BP 125/78  Pulse 80  Temp(Src) 98.4 F (36.9 C) (Oral)  Resp 16  Ht 5\' 7"  (1.702 m)  Wt 220 lb (99.791 kg)  BMI 34.45 kg/m2  SpO2 99%  LMP 03/30/2014 Physical Exam  Nursing note and vitals reviewed. Constitutional: She is oriented to person, place, and time. She appears well-developed and well-nourished.  HENT:  Head: Normocephalic and atraumatic.  Eyes: Conjunctivae and EOM are normal. Pupils are equal, round, and reactive to light.  Neck: Normal range of motion. Neck supple.  Cardiovascular: Normal rate.   Pulmonary/Chest: Effort normal.  Abdominal: Soft. She exhibits no distension. There is no tenderness.  Genitourinary: Vaginal discharge found.  Vaginal discharge,  Thick white,  Adnexa no masses,  Cervix nontender  Musculoskeletal: Normal range of motion.  Neurological: She is alert and oriented to person, place, and time.  Skin: Skin is warm.  Psychiatric: She has a normal mood and affect.    ED Course  Procedures (including critical care time) Labs Review Labs Reviewed  URINALYSIS, ROUTINE W REFLEX MICROSCOPIC  PREGNANCY, URINE   Imaging Review No results found.   EKG Interpretation None      MDM   Final diagnoses:  BV (bacterial vaginosis)    metrogel vaginal    Elson AreasLeslie K Kynisha Memon, PA-C 04/02/14 2042

## 2014-04-02 NOTE — ED Notes (Signed)
Pt c/o vaginal discharge x 2 days 

## 2014-04-02 NOTE — Discharge Instructions (Signed)

## 2014-04-02 NOTE — ED Provider Notes (Signed)
Medical screening examination/treatment/procedure(s) were performed by non-physician practitioner and as supervising physician I was immediately available for consultation/collaboration.   EKG Interpretation None        Yvan Dority, MD 04/02/14 2325 

## 2014-04-04 LAB — GC/CHLAMYDIA PROBE AMP
CT PROBE, AMP APTIMA: NEGATIVE
GC PROBE AMP APTIMA: NEGATIVE

## 2014-04-07 ENCOUNTER — Encounter: Payer: Self-pay | Admitting: Internal Medicine

## 2014-10-27 ENCOUNTER — Encounter (HOSPITAL_BASED_OUTPATIENT_CLINIC_OR_DEPARTMENT_OTHER): Payer: Self-pay | Admitting: Emergency Medicine

## 2014-10-29 ENCOUNTER — Encounter (HOSPITAL_COMMUNITY): Payer: Self-pay | Admitting: *Deleted

## 2014-10-29 ENCOUNTER — Emergency Department (INDEPENDENT_AMBULATORY_CARE_PROVIDER_SITE_OTHER)
Admission: EM | Admit: 2014-10-29 | Discharge: 2014-10-29 | Disposition: A | Discharge status: Home / Self Care | Payer: Self-pay | Source: Home / Self Care | Attending: Emergency Medicine | Admitting: Emergency Medicine

## 2014-10-29 DIAGNOSIS — R110 Nausea: Secondary | ICD-10-CM

## 2014-10-29 DIAGNOSIS — R42 Dizziness and giddiness: Secondary | ICD-10-CM

## 2014-10-29 LAB — POCT I-STAT, CHEM 8
BUN: 6 mg/dL (ref 6–23)
CHLORIDE: 102 meq/L (ref 96–112)
Calcium, Ion: 1.22 mmol/L (ref 1.12–1.23)
Creatinine, Ser: 0.9 mg/dL (ref 0.50–1.10)
Glucose, Bld: 88 mg/dL (ref 70–99)
HCT: 39 % (ref 36.0–46.0)
Hemoglobin: 13.3 g/dL (ref 12.0–15.0)
POTASSIUM: 3.8 meq/L (ref 3.7–5.3)
Sodium: 139 mEq/L (ref 137–147)
TCO2: 28 mmol/L (ref 0–100)

## 2014-10-29 LAB — POCT URINALYSIS DIP (DEVICE)
Bilirubin Urine: NEGATIVE
Glucose, UA: NEGATIVE mg/dL
KETONES UR: NEGATIVE mg/dL
LEUKOCYTES UA: NEGATIVE
Nitrite: NEGATIVE
Protein, ur: NEGATIVE mg/dL
SPECIFIC GRAVITY, URINE: 1.015 (ref 1.005–1.030)
Urobilinogen, UA: 0.2 mg/dL (ref 0.0–1.0)
pH: 8 (ref 5.0–8.0)

## 2014-10-29 LAB — POCT PREGNANCY, URINE: Preg Test, Ur: NEGATIVE

## 2014-10-29 MED ORDER — ONDANSETRON HCL 4 MG PO TABS: 4.0000 mg | ORAL_TABLET | Freq: Three times a day (TID) | ORAL | Status: DC | PRN

## 2014-10-29 NOTE — Discharge Instructions (Signed)
33 y/o F with vague dizziness and nausea ECG: NSR at 66bpm. Normal intervals. No ectopy. No ST/T wave changes.  UA normal Pregnancy test negative Blood work normal.  Careful observation at home. No driving if dizzy. Zofran as prescribed for nausea. Follow up if no improvement ER if suddenly worse or severe.  Dizziness Dizziness is a common problem. It is a feeling of unsteadiness or light-headedness. You may feel like you are about to faint. Dizziness can lead to injury if you stumble or fall. A person of any age group can suffer from dizziness, but dizziness is more common in older adults. CAUSES  Dizziness can be caused by many different things, including:  Middle ear problems.  Standing for too long.  Infections.  An allergic reaction.  Aging.  An emotional response to something, such as the sight of blood.  Side effects of medicines.  Tiredness.  Problems with circulation or blood pressure.  Excessive use of alcohol or medicines, or illegal drug use.  Breathing too fast (hyperventilation).  An irregular heart rhythm (arrhythmia).  A low red blood cell count (anemia).  Pregnancy.  Vomiting, diarrhea, fever, or other illnesses that cause body fluid loss (dehydration).  Diseases or conditions such as Parkinson's disease, high blood pressure (hypertension), diabetes, and thyroid problems.  Exposure to extreme heat. DIAGNOSIS  Your health care provider will ask about your symptoms, perform a physical exam, and perform an electrocardiogram (ECG) to record the electrical activity of your heart. Your health care provider may also perform other heart or blood tests to determine the cause of your dizziness. These may include:  Transthoracic echocardiogram (TTE). During echocardiography, sound waves are used to evaluate how blood flows through your heart.  Transesophageal echocardiogram (TEE).  Cardiac monitoring. This allows your health care provider to monitor your  heart rate and rhythm in real time.  Holter monitor. This is a portable device that records your heartbeat and can help diagnose heart arrhythmias. It allows your health care provider to track your heart activity for several days if needed.  Stress tests by exercise or by giving medicine that makes the heart beat faster. TREATMENT  Treatment of dizziness depends on the cause of your symptoms and can vary greatly. HOME CARE INSTRUCTIONS   Drink enough fluids to keep your urine clear or pale yellow. This is especially important in very hot weather. In older adults, it is also important in cold weather.  Take your medicine exactly as directed if your dizziness is caused by medicines. When taking blood pressure medicines, it is especially important to get up slowly.  Rise slowly from chairs and steady yourself until you feel okay.  In the morning, first sit up on the side of the bed. When you feel okay, stand slowly while holding onto something until you know your balance is fine.  Move your legs often if you need to stand in one place for a long time. Tighten and relax your muscles in your legs while standing.  Have someone stay with you for 1-2 days if dizziness continues to be a problem. Do this until you feel you are well enough to stay alone. Have the person call your health care provider if he or she notices changes in you that are concerning.  Do not drive or use heavy machinery if you feel dizzy.  Do not drink alcohol. SEEK IMMEDIATE MEDICAL CARE IF:   Your dizziness or light-headedness gets worse.  You feel nauseous or vomit.  You have problems  talking, walking, or using your arms, hands, or legs.  You feel weak.  You are not thinking clearly or you have trouble forming sentences. It may take a friend or family member to notice this.  You have chest pain, abdominal pain, shortness of breath, or sweating.  Your vision changes.  You notice any bleeding.  You have side  effects from medicine that seems to be getting worse rather than better. MAKE SURE YOU:   Understand these instructions.  Will watch your condition.  Will get help right away if you are not doing well or get worse. Document Released: 06/07/2001 Document Revised: 12/17/2013 Document Reviewed: 07/01/2011 [facility] Patient Information 2015 [place], [place]. This information is not intended to replace advice given to you by your health care provider. Make sure you discuss any questions you have with your health care provider.  Nausea, Adult Nausea is the feeling that you have an upset stomach or have to vomit. Nausea by itself is not likely a serious concern, but it may be an early sign of more serious medical problems. As nausea gets worse, it can lead to vomiting. If vomiting develops, there is the risk of dehydration.  CAUSES   Viral infections.  Food poisoning.  Medicines.  Pregnancy.  Motion sickness.  Migraine headaches.  Emotional distress.  Severe pain from any source.  Alcohol intoxication. HOME CARE INSTRUCTIONS  Get plenty of rest.  Ask your caregiver about specific rehydration instructions.  Eat small amounts of food and sip liquids more often.  Take all medicines as told by your caregiver. SEEK MEDICAL CARE IF:  You have not improved after 2 days, or you get worse.  You have a headache. SEEK IMMEDIATE MEDICAL CARE IF:   You have a fever.  You faint.  You keep vomiting or have blood in your vomit.  You are extremely weak or dehydrated.  You have dark or bloody stools.  You have severe chest or abdominal pain. MAKE SURE YOU:  Understand these instructions.  Will watch your condition.  Will get help right away if you are not doing well or get worse. Document Released: 01/19/2005 Document Revised: 09/05/2012 Document Reviewed: 08/24/2011 [facility] Patient Information 2015 [place], [place]. This information is not intended to replace advice given to  you by your health care provider. Make sure you discuss any questions you have with your health care provider.

## 2014-10-29 NOTE — ED Notes (Signed)
Pt   Reports    Symptoms      Of   Nausea     dizxyness           With onset  Of  Symptoms yesterday             denys  Any bleeding   denys  Any  Vomiting          denys any diarrhea        Pt  Sitting upright  On  Exam tablespeaking in  Complete  sentances       denys anypain         Skin is warm  And dry      Pt  Reports  A  History of  Anemia in past

## 2014-10-29 NOTE — ED Provider Notes (Signed)
CSN: [id number]     Arrival date & time 10/29/14  1202 History   First MD Initiated Contact with Patient 10/29/14 1247     Chief Complaint  Patient presents with  . Dizziness   (Consider location/radiation/quality/duration/timing/severity/associated sxs/prior Treatment) HPI Comments: Patient reports two days of vague intermittent dizziness and nausea.  LNMP: 10/06/2014. No heavy menses.  Smoker No ETOH or illicit drug use Works in [employer]amed[employer] at [employer].  Denies CP, dyspnea, GU issues. No vomiting or diarrhea.  Son currently ill with strep pharyngitis Reports herself to be otherwise healthy and without recent illness or injury.  PCP: none  Patient is a 33 y.o. female presenting with dizziness. The history is provided by the patient.  Dizziness Associated symptoms: nausea   Associated symptoms: no blood in stool, no headaches and no vomiting     Past Medical History  Diagnosis Date  . HPV in female   . Migraine   . Anxiety    Past Surgical History  Procedure Laterality Date  . Mouth surgery     Family History  Problem Relation Age of Onset  . Colon cancer Mother 6835  . Heart disease Father   . Hypertension Father   . Diabetes Sister   . Asthma Brother   . Diabetes Paternal Grandmother   . Stroke Paternal Grandmother   . Clotting disorder Paternal Grandmother    History  Substance Use Topics  . Smoking status: Former Smoker    Types: Cigarettes  . Smokeless tobacco: Never Used  . Alcohol Use: No     Comment: before pregnancy   OB History    Gravida Para Term Preterm AB TAB SAB Ectopic Multiple Living   5 3 3  2 2    3      Review of Systems  Constitutional: Negative.   HENT: Negative.   Eyes: Negative for photophobia and visual disturbance.  Respiratory: Negative.   Cardiovascular: Negative.   Gastrointestinal: Positive for nausea. Negative for vomiting, abdominal pain, constipation, blood in stool and anal bleeding.  Endocrine: Negative for polydipsia,  polyphagia and polyuria.  Genitourinary: Negative.   Musculoskeletal: Negative.   Skin: Negative.   Neurological: Positive for dizziness. Negative for syncope, weakness, light-headedness, numbness and headaches.  Psychiatric/Behavioral: The patient is not nervous/anxious.     Allergies  Review of patient's allergies indicates no known allergies.  Home Medications   Prior to Admission medications   Medication Sig Start Date End Date Taking? Authorizing Provider  acetaminophen (TYLENOL) 500 MG tablet Take 500 mg by mouth as needed.    Historical Provider, MD  ferrous sulfate 325 (65 FE) MG tablet Take 325 mg by mouth daily with breakfast.    Historical Provider, MD  metroNIDAZOLE (METROGEL VAGINAL) 0.75 % vaginal gel Place 1 Applicatorful vaginally 2 (two) times daily. 04/02/14   Elson AreasLeslie K Sofia, PA-C  ondansetron (ZOFRAN) 4 MG tablet Take 1 tablet (4 mg total) by mouth every 8 (eight) hours as needed for nausea. 10/29/14   Jess BartersJennifer Lee H Genell Thede, PA   BP 124/86 mmHg  Pulse 112  Temp(Src) 98.6 F (37 C) (Oral)  Resp 20  SpO2 100%  LMP 10/06/2014 Physical Exam  Constitutional: She is oriented to person, place, and time. She appears well-developed and well-nourished. No distress.  HENT:  Head: Normocephalic and atraumatic.  Right Ear: Hearing, tympanic membrane, external ear and ear canal normal.  Left Ear: Hearing, tympanic membrane, external ear and ear canal normal.  Nose: Nose normal.  Mouth/Throat: Uvula  is midline, oropharynx is clear and moist and mucous membranes are normal.  Eyes: Conjunctivae, EOM and lids are normal. Pupils are equal, round, and reactive to light. Right eye exhibits no nystagmus. Left eye exhibits no nystagmus.  Neck: Normal range of motion. Neck supple.  Cardiovascular: Normal rate, regular rhythm and normal heart sounds.   Pulmonary/Chest: Effort normal and breath sounds normal.  Abdominal: Soft. Bowel sounds are normal. She exhibits no distension. There  is no tenderness.  Musculoskeletal: Normal range of motion.  Neurological: She is alert and oriented to person, place, and time. She has normal strength. No cranial nerve deficit or sensory deficit. Coordination and gait normal. GCS eye subscore is 4. GCS verbal subscore is 5. GCS motor subscore is 6.  Skin: Skin is warm and dry. No rash noted. No erythema.  Psychiatric: She has a normal mood and affect. Her behavior is normal.  Nursing note and vitals reviewed.   ED Course  Procedures (including critical care time) Labs Review Labs Reviewed  POCT URINALYSIS DIP (DEVICE) - Abnormal; Notable for the following:    Hgb urine dipstick TRACE (*)    All other components within normal limits  POCT I-STAT, CHEM 8  POCT PREGNANCY, URINE    Imaging Review No results found.   MDM   1. Nausea   2. Dizziness     33 y/o F with vague dizziness and nausea ECG: NSR at 66bpm. Normal intervals. No ectopy. No ST/T wave changes.  UA normal UPT negative I-Stat 8 normal.  Will advise careful observation at home. Zofran as prescribed for nausea. Follow up if no improvement ER if suddenly worse or severe.    Ria ClockJennifer Lee H Morgana Rowley, GeorgiaPA 10/29/14 1340

## 2015-03-05 ENCOUNTER — Other Ambulatory Visit: Payer: Self-pay | Admitting: Internal Medicine

## 2015-03-05 ENCOUNTER — Other Ambulatory Visit (HOSPITAL_COMMUNITY)
Admission: RE | Admit: 2015-03-05 | Discharge: 2015-03-05 | Disposition: A | Discharge status: Home / Self Care | Payer: 59 | Source: Ambulatory Visit | Attending: Internal Medicine | Admitting: Internal Medicine

## 2015-03-05 DIAGNOSIS — Z1151 Encounter for screening for human papillomavirus (HPV): Secondary | ICD-10-CM | POA: Diagnosis present

## 2015-03-05 DIAGNOSIS — Z01419 Encounter for gynecological examination (general) (routine) without abnormal findings: Secondary | ICD-10-CM | POA: Diagnosis present

## 2015-03-09 LAB — CYTOLOGY - PAP

## 2015-03-24 ENCOUNTER — Emergency Department (HOSPITAL_COMMUNITY)
Admission: EM | Admit: 2015-03-24 | Discharge: 2015-03-24 | Disposition: A | Discharge status: Home / Self Care | Payer: 59 | Source: Home / Self Care | Attending: Emergency Medicine | Admitting: Emergency Medicine

## 2015-03-24 ENCOUNTER — Encounter (HOSPITAL_COMMUNITY): Payer: Self-pay | Admitting: Emergency Medicine

## 2015-03-24 DIAGNOSIS — J111 Influenza due to unidentified influenza virus with other respiratory manifestations: Secondary | ICD-10-CM

## 2015-03-24 MED ORDER — ONDANSETRON HCL 4 MG PO TABS: 4.0000 mg | ORAL_TABLET | Freq: Three times a day (TID) | ORAL | Status: DC | PRN

## 2015-03-24 MED ORDER — OSELTAMIVIR PHOSPHATE 75 MG PO CAPS: 75.0000 mg | ORAL_CAPSULE | Freq: Two times a day (BID) | ORAL | Status: DC

## 2015-03-24 MED ORDER — ACETAMINOPHEN 325 MG PO TABS
650.0000 mg | ORAL_TABLET | Freq: Once | ORAL | Status: AC
  Administered 2015-03-24: 650 mg via ORAL

## 2015-03-24 MED ORDER — ACETAMINOPHEN 325 MG PO TABS
ORAL_TABLET | ORAL | Status: AC
  Filled 2015-03-24: qty 2

## 2015-03-24 NOTE — ED Provider Notes (Signed)
CSN: [id number]     Arrival date & time 03/24/15  1929 History   First MD Initiated Contact with Patient 03/24/15 2003     Chief Complaint  Patient presents with  . Fever  . Chills  . Generalized Body Aches   (Consider location/radiation/quality/duration/timing/severity/associated sxs/prior Treatment) HPI She is a 34 year old woman here for flulike symptoms. Her symptoms started last night with fevers, chills, body aches, cough, vomiting. She denies any nasal congestion or rhinorrhea. No sore throat. No diarrhea or abdominal pain. She has been tolerating some liquids today. She has not tried any medications yet.  Past Medical History  Diagnosis Date  . HPV in female   . Migraine   . Anxiety    Past Surgical History  Procedure Laterality Date  . Mouth surgery     Family History  Problem Relation Age of Onset  . Colon cancer Mother [id number]  . Heart disease Father   . Hypertension Father   . Diabetes Sister   . Asthma Brother   . Diabetes Paternal Grandmother   . Stroke Paternal Grandmother   . Clotting disorder Paternal Grandmother    History  Substance Use Topics  . Smoking status: Former Smoker    Types: Cigarettes  . Smokeless tobacco: Never Used  . Alcohol Use: No     Comment: before pregnancy   OB History    Gravida Para Term Preterm AB TAB SAB Ectopic Multiple Living   5 3 3  2 2    3      Review of Systems  Constitutional: Positive for fever, chills, appetite change and fatigue.  HENT: Negative for congestion, rhinorrhea and sore throat.   Respiratory: Positive for cough. Negative for shortness of breath.   Gastrointestinal: Positive for nausea and vomiting. Negative for abdominal pain and diarrhea.  Musculoskeletal: Positive for myalgias.    Allergies  Review of patient's allergies indicates no known allergies.  Home Medications   Prior to Admission medications   Medication Sig Start Date End Date Taking? Authorizing Provider  acetaminophen (TYLENOL) 500  MG tablet Take 500 mg by mouth as needed.    Historical Provider, MD  ferrous sulfate 325 (65 FE) MG tablet Take 325 mg by mouth daily with breakfast.    Historical Provider, MD  ondansetron (ZOFRAN) 4 MG tablet Take 1 tablet (4 mg total) by mouth every 8 (eight) hours as needed for nausea or vomiting. 03/24/15   Charm RingsErin J Jourdin Connors, MD  oseltamivir (TAMIFLU) 75 MG capsule Take 1 capsule (75 mg total) by mouth every 12 (twelve) hours. 03/24/15   Charm RingsErin J Lorena Benham, MD   BP 146/81 mmHg  Pulse 103  Temp(Src) 101.3 F (38.5 C) (Oral)  Resp 16  SpO2 99%  LMP 03/07/2015 Physical Exam  Constitutional: She is oriented to person, place, and time. She appears well-developed and well-nourished. She appears distressed (looks ill).  HENT:  Nose: Nose normal.  Mouth/Throat: Oropharynx is clear and moist. No oropharyngeal exudate.  Neck: Neck supple.  Cardiovascular: Normal rate, regular rhythm and normal heart sounds.   No murmur heard. Pulmonary/Chest: Effort normal and breath sounds normal. No respiratory distress. She has no wheezes. She has no rales.  Lymphadenopathy:    She has no cervical adenopathy.  Neurological: She is alert and oriented to person, place, and time.    ED Course  Procedures (including critical care time) Labs Review Labs Reviewed - No data to display  Imaging Review No results found.   MDM   1. Influenza  Tylenol 650 mg by mouth given.  We'll treat with Tamiflu and Zofran. Emphasized fluids and rest. Return precautions reviewed as in after visit summary.    Charm Rings, MD 03/24/15 2036

## 2015-03-24 NOTE — Discharge Instructions (Signed)
You likely have the flu. Take Tamiflu twice a day for the next 10 days. Use Zofran every 8 hours as needed for nausea and vomiting. Drink plenty of fluids and get plenty of rest. You should start to feel better around Friday. Follow-up if you are having trouble breathing, you are unable to keep fluids down, or you are just not getting better.

## 2015-03-24 NOTE — ED Notes (Signed)
Pt states that she feels like she has flu like symptoms

## 2018-10-30 DIAGNOSIS — Z3490 Encounter for supervision of normal pregnancy, unspecified, unspecified trimester: Secondary | ICD-10-CM | POA: Diagnosis not present

## 2018-10-30 DIAGNOSIS — Z124 Encounter for screening for malignant neoplasm of cervix: Secondary | ICD-10-CM | POA: Diagnosis not present

## 2018-10-30 DIAGNOSIS — R87610 Atypical squamous cells of undetermined significance on cytologic smear of cervix (ASC-US): Secondary | ICD-10-CM | POA: Diagnosis not present

## 2018-10-30 DIAGNOSIS — O26849 Uterine size-date discrepancy, unspecified trimester: Secondary | ICD-10-CM | POA: Diagnosis not present

## 2018-10-30 DIAGNOSIS — Z3480 Encounter for supervision of other normal pregnancy, unspecified trimester: Secondary | ICD-10-CM | POA: Diagnosis not present

## 2018-10-30 DIAGNOSIS — O09529 Supervision of elderly multigravida, unspecified trimester: Secondary | ICD-10-CM | POA: Insufficient documentation

## 2018-10-30 LAB — OB RESULTS CONSOLE GC/CHLAMYDIA
Chlamydia: NEGATIVE
Gonorrhea: NEGATIVE

## 2018-11-27 DIAGNOSIS — O3680X9 Pregnancy with inconclusive fetal viability, other fetus: Secondary | ICD-10-CM | POA: Diagnosis not present

## 2018-11-27 DIAGNOSIS — O09521 Supervision of elderly multigravida, first trimester: Secondary | ICD-10-CM | POA: Diagnosis not present

## 2018-11-27 DIAGNOSIS — Z3480 Encounter for supervision of other normal pregnancy, unspecified trimester: Secondary | ICD-10-CM | POA: Diagnosis not present

## 2018-11-27 LAB — OB RESULTS CONSOLE HEPATITIS B SURFACE ANTIGEN: Hepatitis B Surface Ag: NEGATIVE

## 2018-11-27 LAB — OB RESULTS CONSOLE HIV ANTIBODY (ROUTINE TESTING): HIV: NONREACTIVE

## 2018-11-27 LAB — OB RESULTS CONSOLE RPR: RPR: NONREACTIVE

## 2018-11-27 LAB — OB RESULTS CONSOLE RUBELLA ANTIBODY, IGM: Rubella: IMMUNE

## 2018-12-05 DIAGNOSIS — R7303 Prediabetes: Secondary | ICD-10-CM | POA: Diagnosis not present

## 2018-12-25 DIAGNOSIS — Z3480 Encounter for supervision of other normal pregnancy, unspecified trimester: Secondary | ICD-10-CM | POA: Diagnosis not present

## 2018-12-26 NOTE — L&D Delivery Note (Signed)
Delivery Note At 7:43 PM a viable female was delivered via Vaginal, Spontaneous (Presentation: ROA).  APGAR: 9, 9; weight 3589gm (7lb 14.6oz).  Nuchal cord x 1 reduced prior to delivery. Infant dried and placed on pt's abd; cord clamped and cut by CNM; hospital cord blood sample collected. Placenta status: spont, intact.  Cord: 3 vessel  Anesthesia:  Epidural Episiotomy: None Lacerations: None Est. Blood Loss (mL): 345  Mom to postpartum.  Baby to Couplet care / Skin to Skin.  Myrtis Ser CNM 06/07/2019, 8:08 PM  Please schedule this patient for Postpartum visit in: 6 weeks with the following provider: Any provider For C/S patients schedule nurse incision check in weeks 2 weeks: no High risk pregnancy complicated by: GDM Delivery mode:  SVD Anticipated Birth Control:  other/unsure PP Procedures needed: 2 hour GTT  Schedule Integrated [place] visit: no

## 2019-01-22 DIAGNOSIS — Z363 Encounter for antenatal screening for malformations: Secondary | ICD-10-CM | POA: Diagnosis not present

## 2019-01-22 DIAGNOSIS — Z3480 Encounter for supervision of other normal pregnancy, unspecified trimester: Secondary | ICD-10-CM | POA: Diagnosis not present

## 2019-01-29 ENCOUNTER — Inpatient Hospital Stay (HOSPITAL_COMMUNITY)
Admission: AD | Admit: 2019-01-29 | Discharge: 2019-01-29 | Disposition: A | Discharge status: Home / Self Care | Payer: Medicaid Other | Attending: Family Medicine | Admitting: Family Medicine

## 2019-01-29 ENCOUNTER — Encounter (HOSPITAL_COMMUNITY): Payer: Self-pay

## 2019-01-29 ENCOUNTER — Other Ambulatory Visit: Payer: Self-pay

## 2019-01-29 ENCOUNTER — Telehealth: Payer: Self-pay

## 2019-01-29 DIAGNOSIS — O99612 Diseases of the digestive system complicating pregnancy, second trimester: Secondary | ICD-10-CM | POA: Insufficient documentation

## 2019-01-29 DIAGNOSIS — K5900 Constipation, unspecified: Secondary | ICD-10-CM

## 2019-01-29 DIAGNOSIS — O219 Vomiting of pregnancy, unspecified: Secondary | ICD-10-CM

## 2019-01-29 DIAGNOSIS — O212 Late vomiting of pregnancy: Secondary | ICD-10-CM | POA: Diagnosis not present

## 2019-01-29 DIAGNOSIS — Z3A21 21 weeks gestation of pregnancy: Secondary | ICD-10-CM | POA: Diagnosis not present

## 2019-01-29 DIAGNOSIS — Z3480 Encounter for supervision of other normal pregnancy, unspecified trimester: Secondary | ICD-10-CM

## 2019-01-29 DIAGNOSIS — O0990 Supervision of high risk pregnancy, unspecified, unspecified trimester: Secondary | ICD-10-CM

## 2019-01-29 DIAGNOSIS — Z87891 Personal history of nicotine dependence: Secondary | ICD-10-CM | POA: Diagnosis not present

## 2019-01-29 LAB — URINALYSIS, ROUTINE W REFLEX MICROSCOPIC
Glucose, UA: NEGATIVE mg/dL
Hgb urine dipstick: NEGATIVE
KETONES UR: 40 mg/dL — AB
LEUKOCYTES UA: NEGATIVE
Nitrite: NEGATIVE
PROTEIN: NEGATIVE mg/dL
Specific Gravity, Urine: 1.025 (ref 1.005–1.030)
pH: 6.5 (ref 5.0–8.0)

## 2019-01-29 MED ORDER — FAMOTIDINE 20 MG PO TABS: 20.0000 mg | ORAL_TABLET | Freq: Two times a day (BID) | ORAL | 0 refills | Status: DC

## 2019-01-29 MED ORDER — ONDANSETRON 4 MG PO TBDP: 4.0000 mg | ORAL_TABLET | Freq: Three times a day (TID) | ORAL | 0 refills | Status: DC | PRN

## 2019-01-29 MED ORDER — FAMOTIDINE 20 MG PO TABS
40.0000 mg | ORAL_TABLET | Freq: Once | ORAL | Status: AC
Start: 2019-01-29 — End: 2019-01-29
  Administered 2019-01-29: 40 mg via ORAL
  Filled 2019-01-29: qty 2

## 2019-01-29 MED ORDER — ONDANSETRON 8 MG PO TBDP
8.0000 mg | ORAL_TABLET | Freq: Once | ORAL | Status: AC
  Administered 2019-01-29: 8 mg via ORAL
  Filled 2019-01-29: qty 1

## 2019-01-29 MED ORDER — POLYETHYLENE GLYCOL 3350 17 G PO PACK: 17.0000 g | PACK | Freq: Every day | ORAL | 0 refills | Status: DC

## 2019-01-29 NOTE — Discharge Instructions (Signed)

## 2019-01-29 NOTE — Telephone Encounter (Signed)
error 

## 2019-01-29 NOTE — MAU Note (Signed)
Pt states that for the last few days she has had nausea.   Pt reports 1 vomit today.    Pt reports cramping ever since the vomiting at  1400.  Pt reports a bad taste in her mouth after the vomit. She states she brushed her teeth and the taste got better. However, pt reports the bad taste everyone in a while coming back in the back of her throat.

## 2019-01-29 NOTE — MAU Provider Note (Signed)
History     CSN: [id number]  Arrival date and time: 01/29/19 1717   First Provider Initiated Contact with Patient 01/29/19 1801      Chief Complaint  Patient presents with  . Emesis   Samantha Harris is a 38 y.o. [id number] at 309w4d who presents today with nausea and vomiting. She states that she has been really nauseous of about 2 days. She vomited once today. Since then she has had a bad taste in her mouth. She also states that her upper abdomen has felt really crampy. She denies any VB or LOF. She reports fetal movement. Patient has not had a BM in more than 3 days, and prior to that she has only had small pellets of stool.   Emesis   This is a new problem. The current episode started today. The problem occurs less than 2 times per day. The problem has been unchanged. The emesis has an appearance of stomach contents. There has been no fever. Associated symptoms include abdominal pain. Pertinent negatives include no chills, diarrhea or fever. Risk factors include ill contacts. She has tried nothing for the symptoms.    OB History    Gravida  6   Para  3   Term  3   Preterm      AB  2   Living  3     SAB      TAB  2   Ectopic      Multiple      Live Births  3           Past Medical History:  Diagnosis Date  . Anxiety   . HPV in female   . Migraine     Past Surgical History:  Procedure Laterality Date  . MOUTH SURGERY      Family History  Problem Relation Age of Onset  . Colon cancer Mother 235  . Heart disease Father   . Hypertension Father   . Diabetes Sister   . Asthma Brother   . Diabetes Paternal Grandmother   . Stroke Paternal Grandmother   . Clotting disorder Paternal Grandmother     Social History   Tobacco Use  . Smoking status: Former Smoker    Types: Cigarettes  . Smokeless tobacco: Never Used  Substance Use Topics  . Alcohol use: No    Comment: before pregnancy  . Drug use: No    Comment: past marijuana    Allergies: No  Known Allergies  Medications Prior to Admission  Medication Sig Dispense Refill Last Dose  . ferrous sulfate 325 (65 FE) MG tablet Take 325 mg by mouth daily with breakfast.   01/29/2019 at Unknown time  . pseudoephedrine-acetaminophen (TYLENOL SINUS) 30-500 MG TABS tablet Take 1 tablet by mouth every 4 (four) hours as needed.     Marland Kitchen. acetaminophen (TYLENOL) 500 MG tablet Take 500 mg by mouth as needed.   Past Month at Unknown  . ondansetron (ZOFRAN) 4 MG tablet Take 1 tablet (4 mg total) by mouth every 8 (eight) hours as needed for nausea or vomiting. 20 tablet 0   . oseltamivir (TAMIFLU) 75 MG capsule Take 1 capsule (75 mg total) by mouth every 12 (twelve) hours. 10 capsule 0     Review of Systems  Constitutional: Negative for chills and fever.  Gastrointestinal: Positive for abdominal pain, constipation (can't remember when she last had a BM) and vomiting. Negative for diarrhea and nausea.  Genitourinary: Negative for dysuria, frequency, pelvic pain, vaginal  bleeding and vaginal discharge.   Physical Exam   Blood pressure 130/64, pulse 93, temperature 98.3 F (36.8 C), resp. rate 20, weight 107.5 kg, last menstrual period 08/04/2018, SpO2 100 %, unknown if currently breastfeeding.  Physical Exam  Nursing note and vitals reviewed. Constitutional: She is oriented to person, place, and time. She appears well-developed and well-nourished. No distress.  HENT:  Head: Normocephalic.  Cardiovascular: Normal rate.  Respiratory: Effort normal.  GI: Soft. There is no abdominal tenderness. There is no rebound.  Genitourinary:    Genitourinary Comments: Closed/thick    Neurological: She is alert and oriented to person, place, and time.  Skin: Skin is warm and dry.  Psychiatric: She has a normal mood and affect.  +FHT 144 with doppler   Results for orders placed or performed during the hospital encounter of 01/29/19 (from the past 24 hour(s))  Urinalysis, Routine w reflex microscopic      Status: Abnormal   Collection Time: 01/29/19  5:41 PM  Result Value Ref Range   Color, Urine YELLOW YELLOW   APPearance CLEAR CLEAR   Specific Gravity, Urine 1.025 1.005 - 1.030   pH 6.5 5.0 - 8.0   Glucose, UA NEGATIVE NEGATIVE mg/dL   Hgb urine dipstick NEGATIVE NEGATIVE   Bilirubin Urine SMALL (A) NEGATIVE   Ketones, ur 40 (A) NEGATIVE mg/dL   Protein, ur NEGATIVE NEGATIVE mg/dL   Nitrite NEGATIVE NEGATIVE   Leukocytes, UA NEGATIVE NEGATIVE     MAU Course  Procedures  MDM Patient has had enema, and reports excellent results. Cramping has stopped since bowel movement.  Patient has had zofran and pepcid. Sour taste in mouth and nausea are better.   Assessment and Plan   1. Nausea and vomiting during pregnancy prior to [redacted] weeks gestation   2. Constipation, unspecified constipation type   3. [redacted] weeks gestation of pregnancy    DC home Comfort measures reviewed  2nd Trimester precautions  PTL precautions  Fetal kick counts RX: Zofran 4mg  ODT PRN, Pepcid 40mg  QD, Miralax PRN  Return to MAU as needed FU with OB as planned  Follow-up Information    [facility] up.   Contact information: [address] [place] [id number] [phone]           Thressa ShellerHeather Hogan DNP, CNM  01/29/19  7:00 PM

## 2019-02-01 ENCOUNTER — Ambulatory Visit (INDEPENDENT_AMBULATORY_CARE_PROVIDER_SITE_OTHER): Payer: Medicaid Other | Admitting: Medical

## 2019-02-01 ENCOUNTER — Encounter: Payer: Self-pay | Admitting: Medical

## 2019-02-01 VITALS — BP 120/65 | HR 82 | Wt 237.0 lb

## 2019-02-01 DIAGNOSIS — O09522 Supervision of elderly multigravida, second trimester: Secondary | ICD-10-CM

## 2019-02-01 DIAGNOSIS — O99212 Obesity complicating pregnancy, second trimester: Secondary | ICD-10-CM

## 2019-02-01 DIAGNOSIS — E669 Obesity, unspecified: Secondary | ICD-10-CM

## 2019-02-01 DIAGNOSIS — Z3482 Encounter for supervision of other normal pregnancy, second trimester: Secondary | ICD-10-CM | POA: Diagnosis not present

## 2019-02-01 DIAGNOSIS — Z3A22 22 weeks gestation of pregnancy: Secondary | ICD-10-CM

## 2019-02-01 DIAGNOSIS — O99210 Obesity complicating pregnancy, unspecified trimester: Secondary | ICD-10-CM | POA: Insufficient documentation

## 2019-02-01 DIAGNOSIS — Z3480 Encounter for supervision of other normal pregnancy, unspecified trimester: Secondary | ICD-10-CM

## 2019-02-01 NOTE — Progress Notes (Signed)
   PRENATAL VISIT NOTE  Subjective:  Samantha Harris is a 38 y.o. [id number] at [redacted]w[redacted]d being seen today for ongoing prenatal care. She is a transfer from [facility].  She is currently monitored for the following issues for this low-risk pregnancy and has Panic disorder with agoraphobia and mild panic attacks; Tobacco abuse; Family history of colon cancer requiring screening colonoscopy; Hidradenitis axillaris; Obesity (BMI 30-39.9); Advanced maternal age in multigravida; Supervision of other normal pregnancy, antepartum; and Obesity in pregnancy, antepartum on their problem list.  Patient reports no complaints.  Contractions: Not present. Vag. Bleeding: None.  Movement: Present. Denies leaking of fluid.   The following portions of the patient's history were reviewed and updated as appropriate: allergies, current medications, past family history, past medical history, past social history, past surgical history and problem list. Problem list updated.  Objective:   Vitals:   02/01/19 0939  BP: 120/65  Pulse: 82  Weight: 237 lb (107.5 kg)    Fetal Status: Fetal Heart Rate (bpm): 145   Movement: Present     General:  Alert, oriented and cooperative. Patient is in no acute distress.  Skin: Skin is warm and dry. No rash noted.   Cardiovascular: Normal heart rate noted  Respiratory: Normal respiratory effort, no problems with respiration noted  Abdomen: Soft, gravid, appropriate for gestational age.  Pain/Pressure: Absent     Pelvic: Cervical exam deferred        Extremities: Normal range of motion.  Edema: None  Mental Status: Normal mood and affect. Normal behavior. Normal judgment and thought content.   Assessment and Plan:  Pregnancy: [id number] at [redacted]w[redacted]d  1. Supervision of other normal pregnancy, antepartum - ROI sent for records from [facility] - Per patient normal prenatal labs and [place] last week normal aside from possible low lying placenta, will review records upon  receipt for completeness.  - [facility] AMB BABYSCRIPTS SCHEDULE OPTIMIZATION  2. Multigravida of advanced maternal age in second trimester  3. Obesity (BMI 30-39.9) - taking 81 mg ASA daily   4. Obesity in pregnancy, antepartum - [place] MFM OB FOLLOW UP; growth scan  Preterm labor symptoms and general obstetric precautions including but not limited to vaginal bleeding, contractions, leaking of fluid and fetal movement were reviewed in detail with the patient. Please refer to After Visit Summary for other counseling recommendations.  Return in about 4 weeks (around 03/01/2019) for LOB with a midwife.  Future Appointments  Date Time Provider Department Center  03/01/2019 11:15 AM Marny Lowenstein, PA-C [facility] None    Vonzella Nipple, PA-C

## 2019-02-01 NOTE — Patient Instructions (Addendum)
BENEFITS OF BREASTFEEDING Many women wonder if they should breastfeed. Research shows that breast milk contains the perfect balance of vitamins, protein and fat that your baby needs to grow. It also contains antibodies that help your baby's immune system to fight off viruses and bacteria and can reduce the risk of sudden infant death syndrome (SIDS). In addition, the colostrum (a fluid secreted from the breast in the first few days after delivery) helps your newborn's digestive system to grow and function well. Breast milk is easier to digest than formula. Also, if your baby is born preterm, breast milk can help to reduce both short- and long-term health problems. BENEFITS OF BREASTFEEDING FOR MOM . Breastfeeding causes a hormone to be released that helps the uterus to contract and return to its normal size more quickly. . It aids in postpartum weight loss, reduces risk of breast and ovarian cancer, heart disease and rheumatoid arthritis. . It decreases the amount of bleeding after the baby is born. benefits of breastfeeding for baby . Provides comfort and nutrition . Protects baby against - Obesity - Diabetes - Asthma - Childhood cancers - Heart disease - Ear infections - Diarrhea - Pneumonia - Stomach problems - Serious allergies - Skin rashes . Promotes growth and development . Reduces the risk of baby having Sudden Infant Death Syndrome (SIDS) only breastmilk for the first 6 months . Protects baby against diseases/allergies . It's the perfect amount for tiny bellies . It restores baby's energy . Provides the best nutrition for baby . Giving water or formula can make baby more likely to get sick, decrease Mom's milk supply, make baby less content with breastfeeding Skin to Skin After delivery, the staff will place your baby on your chest. This helps with the following: . Regulates baby's temperature, breathing, heart rate and blood sugar . Increases Mom's milk supply . Promotes  bonding . Keeps baby and Mom calm and decreases baby's crying Rooming In Your baby will stay in your room with you for the entire time you are in the hospital. This helps with the following: . Allows Mom to learn baby's feeding cues - Fluttering eyes - Sucking on tongue or hand - Rooting (opens mouth and turns head) - Nuzzling into the breast - Bringing hand to mouth . Allows breastfeeding on demand (when your baby is ready) . Helps baby to be calm and content . Ensures a good milk supply . Prevents complications with breastfeeding . Allows parents to learn to care for baby . Allows you to request assistance with breastfeeding Importance of a good latch . Increases milk transfer to baby - baby gets enough milk . Ensures you have enough milk for your baby . Decreases nipple soreness . Don't use pacifiers and bottles - these cause baby to suck differently than breastfeeding . Promotes continuation of breastfeeding Risks of Formula Supplementation with Breastfeeding Giving your infant formula in addition to your breast-milk EXCEPT when medically necessary can lead to: Marland Kitchen Decreases your milk supply  . Loss of confidence in yourself for providing baby's nutrition  . Engorgement and possibly mastitis  . Asthma & allergies in the baby BREASTFEEDING FAQS How long should I breastfeed my baby? It is recommended that you provide your baby with breast milk only for the first 6 months and then continue for the first year and longer as desired. During the first few weeks after birth, your baby will need to feed 8-12 times every 24 hours, or every 2-3 hours. They will likely feed  for 15-30 minutes. How can I help my baby begin breastfeeding? Babies are born with an instinct to breastfeed. A healthy baby can begin breastfeeding right away without specific help. At the hospital, a nurse (or lactation consultant) will help you begin the process and will give you tips on good positioning. It may be  helpful to take a breastfeeding class before you deliver in order to know what to expect. How can I help my baby latch on? In order to assist your baby in latching-on, cup your breast in your hand and stroke your baby's lower lip with your nipple to stimulate your baby's rooting reflex. Your baby will look like he or she is yawning, at which point you should bring the baby towards your breast, while aiming the nipple at the roof of his or her mouth. Remember to bring the baby towards you and not your breast towards the baby. How can I tell if my baby is latched-on? Your baby will have all of your nipple and part of the dark area around the nipple in his or her mouth and your baby's nose will be touching your breast. You should see or hear the baby swallowing. If the baby is not latched-on properly, start the process over. To remove the suction, insert a clean finger between your breast and the baby's mouth. Should I switch breasts during feeding? After feeding on one side, switch the baby to your other breast. If he or she does not continue feeding - that is OK. Your baby will not necessarily need to feed from both breasts in a single feeding. On the next feeding, start with the other breast for efficiency and comfort. How can I tell if my baby is hungry? When your baby is hungry, they will nuzzle against your breast, make sucking noises and tongue motions and may put their hands near their mouth. Crying is a late sign of hunger, so you should not wait until this point. When they have received enough milk, they will unlatch from the breast. Is it okay to use a pacifier? Until your baby gets the hang of breastfeeding, experts recommend limiting pacifier usage. If you have questions about this, please contact your pediatrician. What can I do to ensure proper nutrition while breastfeeding? . Make sure that you support your own health and your baby's by eating a healthy, well-balanced diet . Your provider  may recommend that you continue to take your prenatal vitamin . Drink plenty of fluids. It is a good rule to drink one glass of water before or after feeding . Alcohol will remain in the breast milk for as long as it will remain in the blood stream. If you choose to have a drink, it is recommended that you wait at least 2 hours before feeding . Moderate amounts of caffeine are OK . Some over-the-counter or prescription medications are not recommended during breastfeeding. Check with your provider if you have questions What types of birth control methods are safe while breastfeeding? Progestin-only methods, including a daily pill, an IUD, the implant and the injection are safe while breastfeeding. Methods that contain estrogen (such as combination birth control pills, the vaginal ring and the patch) should not be used during the first month of breastfeeding as these can decrease your milk supply.  Considering Waterbirth? Guide for patients at [facility] [employer]  Why consider waterbirth?  . Gentle birth for babies . Less pain medicine used in labor . May allow for passive descent/less pushing .  May reduce perineal tears  . More mobility and instinctive maternal position changes . Increased maternal relaxation . Reduced blood pressure in labor  Is waterbirth safe? What are the risks of infection, drowning or other complications?  . Infection: o Very low risk (3.7 % for tub vs 4.8% for bed) o 7 in 8000 waterbirths with documented infection o Poorly cleaned equipment most common cause o Slightly lower group B strep transmission rate  . Drowning o Maternal:  - Very low risk   - Related to seizures or fainting o Newborn:  - Very low risk. No evidence of increased risk of respiratory problems in multiple large studies - Physiological protection from breathing under water - Avoid underwater birth if there are any fetal complications - Once baby's head is out of the water, keep  it out.  . Birth complication o Some reports of cord trauma, but risk decreased by bringing baby to surface gradually o No evidence of increased risk of shoulder dystocia. Mothers can usually change positions faster in water than in a bed, possibly aiding the maneuvers to free the shoulder.   You must attend a Doren Custard class at [facility]  3rd Wednesday of every month from 7-9pm  [employer] by calling [phone] or online at [email or web address]  Bring [place] the certificate from the class to your prenatal appointment  Meet with a midwife at 36 weeks to see if you can still plan a waterbirth and to sign the consent.   If you plan a waterbirth after February 17, 2019, at [facility], you will need to purchase the following:  Fish net  Bathing suit top (optional)  Long-handled mirror (optional)  If you plan a waterbirth before February 17, 2019: Purchase or rent the following supplies: You are responsible for providing all supplies listed above. **If you do not have all necessary supplies you cannot have a waterbirth.**   Water Birth Pool (Birth Pool in a Box or [place] for instance)  (Tubs start ~$125)  Single-use disposable tub liner designed for your brand of tub  Electric drain pump to remove water (We recommend 792 gallon per hour or greater pump.)   New garden hose labeled "lead-free", "suitable for drinking water",  Separate garden hose to remove the dirty water  Fish net  Bathing suit top (optional)  Long-handled mirror (optional)  Places to purchase or rent supplies:   [employer] for tub purchases and supplies  [employer] for tub purchases and supplies  [employer] (www.thelaborladies.com) $275 for tub rental/set-up & take down/kit   [employer] (http://www.fleming.com/.htm) Information regarding doulas (labor support) who provide pool rentals  Things  that would prevent you from having a waterbirth:  Premature, <37wks  Previous cesarean birth  Presence of thick meconium-stained fluid  Multiple gestation (Twins, triplets, etc.)  Uncontrolled diabetes or gestational diabetes requiring medication  Hypertension requiring medication or diagnosis of pre-eclampsia  Heavy vaginal bleeding  Non-reassuring fetal heart rate  Active infection (MRSA, etc.). Group B Strep is NOT a contraindication for waterbirth.  If your labor has to be induced and induction method requires continuous monitoring of the baby's heart rate  Other risks/issues identified by your obstetrical provider  Please remember that birth is unpredictable. Under certain unforeseeable circumstances your provider may advise against giving birth in the tub. These decisions will be made on a case-by-case basis and with the safety of you and your baby as our highest priority.  Places to  have your son circumcised:    [facility] [phone] while you are in hospital  [facility] [phone] $244 by 4 wks  [facility] [phone] $175 by 2 wks  [facility] [phone] $250 by 7 days [facility] [phone] $269 by 4 wks  These prices sometimes change but are roughly what you can expect to pay. Please call and confirm pricing.   Circumcision is considered an elective/non-medically necessary procedure. There are many reasons parents decide to have their sons circumsized. During the first year of life circumcised males have a reduced risk of urinary tract infections but after this year the rates between circumcised males and uncircumcised males are the same.  It is safe to have your son circumcised outside of the hospital and the places above perform them regularly.   Deciding about Circumcision in Baby Boys   (Up-to-date The Basics)  What is circumcision?  Circumcision is a surgery that removes the skin that covers the tip of the penis, called the "foreskin" Circumcision is usually done when a boy is between 56 and 6 days old. In the [place], circumcision is common. In some other countries, fewer boys are circumcised. Circumcision is a common tradition in some religions.  Should I have my baby boy circumcised?  There is no easy answer. Circumcision has some benefits. But it also has risks. After talking with your doctor, you will have to decide for yourself what is right for your family.  What are the benefits of circumcision?  Circumcised boys seem to have slightly lower rates of: ?Urinary tract infections ?Swelling of the opening at the tip of the penis Circumcised men seem to have slightly lower rates of: ?Urinary tract infections ?Swelling of the opening at the tip of the penis ?Penis cancer ?HIV and other infections that you catch during sex ?Cervical cancer in the women they have sex with Even so, in the [place], the risks of these problems are small - even in boys and men who have not been circumcised. Plus, boys and men who are not circumcised can reduce these extra risks by: ?Cleaning their penis well ?Using condoms during sex  What are the risks of circumcision?  Risks include: ?Bleeding or infection from the surgery ?Damage to or amputation of the penis ?A chance that the doctor will cut off too much or not enough of the foreskin ?A chance that sex won't feel as good later in life Only about 1 out of every 200 circumcisions leads to problems. There is also a chance that your health insurance won't pay for circumcision.  How is circumcision done in baby boys?  First, the baby gets medicine for pain relief. This might be a cream on the skin or a shot into the base of the penis. Next, the doctor cleans the baby's penis well. Then he or she uses special tools to  cut off the foreskin. Finally, the doctor wraps a bandage (called gauze) around the baby's penis. If you have your baby circumcised, his doctor or nurse will give you instructions on how to care for him after the surgery. It is important that you follow those instructions carefully.

## 2019-02-01 NOTE — Addendum Note (Signed)
Addended by: Marny Lowenstein on: 02/01/2019 10:19 AM   Modules accepted: Orders

## 2019-02-07 ENCOUNTER — Other Ambulatory Visit (HOSPITAL_COMMUNITY): Payer: Medicaid Other

## 2019-03-01 ENCOUNTER — Encounter: Payer: Medicaid Other | Admitting: Medical

## 2019-03-05 ENCOUNTER — Ambulatory Visit (HOSPITAL_COMMUNITY)
Admission: RE | Admit: 2019-03-05 | Discharge: 2019-03-05 | Disposition: A | Discharge status: Home / Self Care | Payer: Medicaid Other | Source: Ambulatory Visit | Attending: Medical | Admitting: Medical

## 2019-03-05 ENCOUNTER — Ambulatory Visit (INDEPENDENT_AMBULATORY_CARE_PROVIDER_SITE_OTHER): Payer: Medicaid Other

## 2019-03-05 ENCOUNTER — Ambulatory Visit (HOSPITAL_COMMUNITY): Payer: Medicaid Other

## 2019-03-05 ENCOUNTER — Encounter: Payer: Self-pay | Admitting: Obstetrics and Gynecology

## 2019-03-05 VITALS — BP 121/76 | HR 88 | Wt 235.6 lb

## 2019-03-05 DIAGNOSIS — O99210 Obesity complicating pregnancy, unspecified trimester: Secondary | ICD-10-CM

## 2019-03-05 DIAGNOSIS — Z3480 Encounter for supervision of other normal pregnancy, unspecified trimester: Secondary | ICD-10-CM

## 2019-03-05 DIAGNOSIS — Z363 Encounter for antenatal screening for malformations: Secondary | ICD-10-CM | POA: Diagnosis not present

## 2019-03-05 DIAGNOSIS — Z3A26 26 weeks gestation of pregnancy: Secondary | ICD-10-CM

## 2019-03-05 DIAGNOSIS — Z3482 Encounter for supervision of other normal pregnancy, second trimester: Secondary | ICD-10-CM

## 2019-03-05 NOTE — Progress Notes (Signed)
   PRENATAL VISIT NOTE  Subjective:  Samantha Harris is a 38 y.o. [id number] at [redacted]w[redacted]d who presents today for routine prenatal care.  She is currently being monitored for supervision of a low-risk pregnancy with problems as listed below.  Patient requests to review her recent anatomy scan that was completed today at [facility].  She reports that the fetal brain views was not completed and she was told by her previous practice that she had a "side cord insertion."  Patient has completed her WB class and has certificate.  Patient endorses fetal movement and denies contractions/cramping.  She denies vaginal concerns including discharge, bleeding, leaking, itching, and burning.   Patient Active Problem List   Diagnosis Date Noted  . Obesity in pregnancy, antepartum 02/01/2019  . Supervision of other normal pregnancy, antepartum 01/29/2019  . Advanced maternal age in multigravida 10/30/2018  . Obesity (BMI 30-39.9) 06/12/2012  . Family history of colon cancer requiring screening colonoscopy 03/06/2012  . Hidradenitis axillaris 03/06/2012  . Panic disorder with agoraphobia and mild panic attacks 11/28/2011  . Tobacco abuse 11/28/2011    The following portions of the patient's history were reviewed and updated as appropriate: allergies, current medications, past family history, past medical history, past social history, past surgical history and problem list. Problem list updated.  Objective:   Vitals:   03/05/19 1609  BP: 121/76  Pulse: 88  Weight: 235 lb 9.6 oz (106.9 kg)    Fetal Status: Fetal Heart Rate (bpm): 138 Fundal Height: 31 cm Movement: Present     General:  Alert, oriented and cooperative. Patient is in no acute distress.  Skin: Skin is warm and dry.   Cardiovascular: Regular rate and rhythm.  Respiratory: Normal respiratory effort. CTA-Bilaterally  Abdomen: Soft, gravid, appropriate for gestational age.  Pelvic: Cervical exam deferred        Extremities: Normal range of motion.   Edema: Trace  Mental Status: Normal mood and affect. Normal behavior. Normal judgment and thought content.   Assessment and Plan:  Pregnancy: [id number] at [redacted]w[redacted]d  1. Supervision of other normal pregnancy, antepartum -Anticipatory guidance given for upcoming appointments. -Discussed GTT labs next visit and need to maintain NPO status. Orders placed:  *CBC; Future *Glucose Tolerance, 2 Hours w/1 Hour; Future *HIV Antibody (routine testing w rflx); Future *RPR; Future -Discussed that [place] results would be sent to ordering provider and can be reviewed at next visit. -Patient unsure of contraception methods for PP and is unsure of if this is her last child.  Information given. -Requests school excuse, for CMA course, for weight restrictions and bathroom breaks.  Message sent to check out staff.   Preterm labor symptoms and general obstetric precautions including but not limited to vaginal bleeding, contractions, leaking of fluid and fetal movement were reviewed with the patient.  Please refer to After Visit Summary for other counseling recommendations.  Return in about 2 weeks (around 03/19/2019) for ROB with GTT.  Future Appointments  Date Time Provider Department Center  04/02/2019  3:00 PM [facility] [place] 3 [facility]    Cherre Robins, CNM 03/05/2019, 4:58 PM

## 2019-03-05 NOTE — Patient Instructions (Signed)
Contraception Choices  Contraception, also called birth control, refers to methods or devices that prevent pregnancy.  Hormonal methods  Contraceptive implant    A contraceptive implant is a thin, plastic tube that contains a hormone. It is inserted into the upper part of the arm. It can remain in place for up to 3 years.  Progestin-only injections  Progestin-only injections are injections of progestin, a synthetic form of the hormone progesterone. They are given every 3 months by a health care provider.  Birth control pills    Birth control pills are pills that contain hormones that prevent pregnancy. They must be taken once a day, preferably at the same time each day.  Birth control patch    The birth control patch contains hormones that prevent pregnancy. It is placed on the skin and must be changed once a week for three weeks and removed on the fourth week. A prescription is needed to use this method of contraception.  Vaginal ring    A vaginal ring contains hormones that prevent pregnancy. It is placed in the vagina for three weeks and removed on the fourth week. After that, the process is repeated with a new ring. A prescription is needed to use this method of contraception.  Emergency contraceptive  Emergency contraceptives prevent pregnancy after unprotected sex. They come in pill form and can be taken up to 5 days after sex. They work best the sooner they are taken after having sex. Most emergency contraceptives are available without a prescription. This method should not be used as your only form of birth control.  Barrier methods  Female condom    A female condom is a thin sheath that is worn over the penis during sex. Condoms keep sperm from going inside a woman's body. They can be used with a spermicide to increase their effectiveness. They should be disposed after a single use.  Female condom    A female condom is a soft, loose-fitting sheath that is put into the vagina before sex. The condom keeps sperm  from going inside a woman's body. They should be disposed after a single use.  Diaphragm    A diaphragm is a soft, dome-shaped barrier. It is inserted into the vagina before sex, along with a spermicide. The diaphragm blocks sperm from entering the uterus, and the spermicide kills sperm. A diaphragm should be left in the vagina for 6-8 hours after sex and removed within 24 hours.  A diaphragm is prescribed and fitted by a health care provider. A diaphragm should be replaced every 1-2 years, after giving birth, after gaining more than 15 lb (6.8 kg), and after pelvic surgery.  Cervical cap    A cervical cap is a round, soft latex or plastic cup that fits over the cervix. It is inserted into the vagina before sex, along with spermicide. It blocks sperm from entering the uterus. The cap should be left in place for 6-8 hours after sex and removed within 48 hours. A cervical cap must be prescribed and fitted by a health care provider. It should be replaced every 2 years.  Sponge    A sponge is a soft, circular piece of polyurethane foam with spermicide on it. The sponge helps block sperm from entering the uterus, and the spermicide kills sperm. To use it, you make it wet and then insert it into the vagina. It should be inserted before sex, left in for at least 6 hours after sex, and removed and thrown away within   30 hours.  Spermicides  Spermicides are chemicals that kill or block sperm from entering the cervix and uterus. They can come as a cream, jelly, suppository, foam, or tablet. A spermicide should be inserted into the vagina with an applicator at least 10-15 minutes before sex to allow time for it to work. The process must be repeated every time you have sex. Spermicides do not require a prescription.  Intrauterine contraception  Intrauterine device (IUD)  An IUD is a T-shaped device that is put in a woman's uterus. There are two types:   Hormone IUD.This type contains progestin, a synthetic form of the hormone  progesterone. This type can stay in place for 3-5 years.   Copper IUD.This type is wrapped in copper wire. It can stay in place for 10 years.    Permanent methods of contraception  Female tubal ligation  In this method, a woman's fallopian tubes are sealed, tied, or blocked during surgery to prevent eggs from traveling to the uterus.  Hysteroscopic sterilization  In this method, a small, flexible insert is placed into each fallopian tube. The inserts cause scar tissue to form in the fallopian tubes and block them, so sperm cannot reach an egg. The procedure takes about 3 months to be effective. Another form of birth control must be used during those 3 months.  Female sterilization  This is a procedure to tie off the tubes that carry sperm (vasectomy). After the procedure, the man can still ejaculate fluid (semen).  Natural planning methods  Natural family planning  In this method, a couple does not have sex on days when the woman could become pregnant.  Calendar method  This means keeping track of the length of each menstrual cycle, identifying the days when pregnancy can happen, and not having sex on those days.  Ovulation method  In this method, a couple avoids sex during ovulation.  Symptothermal method  This method involves not having sex during ovulation. The woman typically checks for ovulation by watching changes in her temperature and in the consistency of cervical mucus.  Post-ovulation method  In this method, a couple waits to have sex until after ovulation.  Summary   Contraception, also called birth control, means methods or devices that prevent pregnancy.   Hormonal methods of contraception include implants, injections, pills, patches, vaginal rings, and emergency contraceptives.   Barrier methods of contraception can include female condoms, female condoms, diaphragms, cervical caps, sponges, and spermicides.   There are two types of IUDs (intrauterine devices). An IUD can be put in a woman's uterus to  prevent pregnancy for 3-5 years.   Permanent sterilization can be done through a procedure for males, females, or both.   Natural family planning methods involve not having sex on days when the woman could become pregnant.  This information is not intended to replace advice given to you by your health care provider. Make sure you discuss any questions you have with your health care provider.  Document Released: 12/12/2005 Document Revised: 12/14/2017 Document Reviewed: 01/14/2017  Elsevier Interactive Patient Education  2019 [employer]

## 2019-03-06 ENCOUNTER — Other Ambulatory Visit (HOSPITAL_COMMUNITY): Payer: Self-pay | Admitting: *Deleted

## 2019-03-06 DIAGNOSIS — O09523 Supervision of elderly multigravida, third trimester: Secondary | ICD-10-CM

## 2019-03-14 ENCOUNTER — Telehealth: Payer: Self-pay

## 2019-03-14 NOTE — Telephone Encounter (Signed)
Returned call, no answer, left vm 

## 2019-03-15 ENCOUNTER — Telehealth: Payer: Self-pay

## 2019-03-15 NOTE — Telephone Encounter (Signed)
Returned call, advised that I spoke with provider and was advised that she may not be written out of work due to the coronavirus. Advised of the importance of hand washing, etc.

## 2019-03-22 ENCOUNTER — Encounter: Payer: Self-pay | Admitting: Medical

## 2019-03-22 ENCOUNTER — Ambulatory Visit (INDEPENDENT_AMBULATORY_CARE_PROVIDER_SITE_OTHER): Payer: Medicaid Other | Admitting: Medical

## 2019-03-22 ENCOUNTER — Other Ambulatory Visit: Payer: Self-pay

## 2019-03-22 ENCOUNTER — Other Ambulatory Visit: Payer: Medicaid Other

## 2019-03-22 VITALS — BP 121/70 | HR 78 | Wt 239.1 lb

## 2019-03-22 DIAGNOSIS — Z3480 Encounter for supervision of other normal pregnancy, unspecified trimester: Secondary | ICD-10-CM

## 2019-03-22 DIAGNOSIS — O09523 Supervision of elderly multigravida, third trimester: Secondary | ICD-10-CM

## 2019-03-22 DIAGNOSIS — Z3A29 29 weeks gestation of pregnancy: Secondary | ICD-10-CM

## 2019-03-22 DIAGNOSIS — Z23 Encounter for immunization: Secondary | ICD-10-CM

## 2019-03-22 DIAGNOSIS — N949 Unspecified condition associated with female genital organs and menstrual cycle: Secondary | ICD-10-CM

## 2019-03-22 DIAGNOSIS — O99213 Obesity complicating pregnancy, third trimester: Secondary | ICD-10-CM

## 2019-03-22 DIAGNOSIS — O99210 Obesity complicating pregnancy, unspecified trimester: Secondary | ICD-10-CM

## 2019-03-22 MED ORDER — COMFORT FIT MATERNITY SUPP LG MISC: 1.0000 [IU] | Freq: Every day | 0 refills | Status: DC | PRN

## 2019-03-22 NOTE — Progress Notes (Signed)
   PRENATAL VISIT NOTE  Subjective:  Samantha Harris is a 38 y.o. [id number] at [redacted]w[redacted]d being seen today for ongoing prenatal care.  She is currently monitored for the following issues for this high-risk pregnancy and has Panic disorder with agoraphobia and mild panic attacks; Tobacco abuse; Family history of colon cancer requiring screening colonoscopy; Hidradenitis axillaris; Obesity (BMI 30-39.9); Advanced maternal age in multigravida; Supervision of other normal pregnancy, antepartum; and Obesity in pregnancy, antepartum on their problem list.  Patient reports backache and pelvic pressure.  Contractions: Not present. Vag. Bleeding: None.  Movement: Present. Denies leaking of fluid.   The following portions of the patient's history were reviewed and updated as appropriate: allergies, current medications, past family history, past medical history, past social history, past surgical history and problem list.   Objective:   Vitals:   03/22/19 0913  BP: 121/70  Pulse: 78  Weight: 239 lb 1.6 oz (108.5 kg)    Fetal Status: Fetal Heart Rate (bpm): 138 Fundal Height: 33 cm Movement: Present     General:  Alert, oriented and cooperative. Patient is in no acute distress.  Skin: Skin is warm and dry. No rash noted.   Cardiovascular: Normal heart rate noted  Respiratory: Normal respiratory effort, no problems with respiration noted  Abdomen: Soft, gravid, appropriate for gestational age.  Pain/Pressure: Present     Pelvic: Cervical exam deferred        Extremities: Normal range of motion.  Edema: None  Mental Status: Normal mood and affect. Normal behavior. Normal judgment and thought content.   Assessment and Plan:  Pregnancy: [id number] at [redacted]w[redacted]d 1. Supervision of other normal pregnancy, antepartum - Glucose Tolerance, 2 Hours w/1 Hour - RPR - CBC - HIV antibody (with reflex) - Tdap vaccine greater than or equal to 7yo IM - Encouraged patient to call ahead if experiencing respiratory symptoms  with fever so that she can be triaged and directed appropriately if testing is warranted - Patient requesting to be written out of work due to COVID -19, works at [employer]. Advised that this is not recommended by ACOG at this time. Patient advised that if she chooses to take leave early we will support her in any way that we can, but her leave is not currently medically indicated  2. Obesity in pregnancy, antepartum - Taking ASA - Growth [place] scheduled for 4/7  3. Multigravida of advanced maternal age in third trimester - < 57 years old, no change in management  4. Round ligament pain - Elastic Bandages & Supports (COMFORT FIT MATERNITY SUPP LG) MISC; 1 Units by Does not apply route daily as needed.  Dispense: 1 each; Refill: 0  Preterm labor symptoms and general obstetric precautions including but not limited to vaginal bleeding, contractions, leaking of fluid and fetal movement were reviewed in detail with the patient. Please refer to After Visit Summary for other counseling recommendations.   Return in about 2 weeks (around 04/05/2019) for LOB with midwife.  Future Appointments  Date Time Provider Department Center  04/02/2019  3:00 PM [facility] [place] 3 [facility]    Vonzella Nipple, PA-C

## 2019-03-22 NOTE — Patient Instructions (Addendum)
Tdap Vaccine (Tetanus, Diphtheria and Pertussis): What You Need to Know 1. Why get vaccinated? Tetanus, diphtheria and pertussis are very serious diseases. Tdap vaccine can protect [place] from these diseases. And, Tdap vaccine given to pregnant women can protect newborn babies against pertussis.Marland Kitchen TETANUS (Lockjaw) is rare in the [place] today. It causes painful muscle tightening and stiffness, usually all over the body.  It can lead to tightening of muscles in the head and neck so you can't open your mouth, swallow, or sometimes even breathe. Tetanus kills about 1 out of 10 people who are infected even after receiving the best medical care. DIPHTHERIA is also rare in the [place] today. It can cause a thick coating to form in the back of the throat.  It can lead to breathing problems, heart failure, paralysis, and death. PERTUSSIS (Whooping Cough) causes severe coughing spells, which can cause difficulty breathing, vomiting and disturbed sleep.  It can also lead to weight loss, incontinence, and rib fractures. Up to 2 in 100 adolescents and 5 in 100 adults with pertussis are hospitalized or have complications, which could include pneumonia or death. These diseases are caused by bacteria. Diphtheria and pertussis are spread from person to person through secretions from coughing or sneezing. Tetanus enters the body through cuts, scratches, or wounds. Before vaccines, as many as 200,000 cases of diphtheria, 200,000 cases of pertussis, and hundreds of cases of tetanus, were reported in the [place] each year. Since vaccination began, reports of cases for tetanus and diphtheria have dropped by about 99% and for pertussis by about 80%. 2. Tdap vaccine Tdap vaccine can protect adolescents and adults from tetanus, diphtheria, and pertussis. One dose of Tdap is routinely given at age 61 or 77. People who did not get Tdap at that age should get it as soon as possible. Tdap is especially important  for healthcare professionals and anyone having close contact with a baby younger than 12 months. Pregnant women should get a dose of Tdap during every pregnancy, to protect the newborn from pertussis. Infants are most at risk for severe, life-threatening complications from pertussis. Another vaccine, called Td, protects against tetanus and diphtheria, but not pertussis. A Td booster should be given every 10 years. Tdap may be given as one of these boosters if you have never gotten Tdap before. Tdap may also be given after a severe cut or burn to prevent tetanus infection. Your doctor or the person giving you the vaccine can give you more information. Tdap may safely be given at the same time as other vaccines. 3. Some people should not get this vaccine  A person who has ever had a life-threatening allergic reaction after a previous dose of any diphtheria, tetanus or pertussis containing vaccine, OR has a severe allergy to any part of this vaccine, should not get Tdap vaccine. Tell the person giving the vaccine about any severe allergies.  Anyone who had coma or long repeated seizures within 7 days after a childhood dose of DTP or DTaP, or a previous dose of Tdap, should not get Tdap, unless a cause other than the vaccine was found. They can still get Td.  Talk to your doctor if you: ? have seizures or another nervous system problem, ? had severe pain or swelling after any vaccine containing diphtheria, tetanus or pertussis, ? ever had a condition called Guillain-Barr Syndrome (GBS), ? aren't feeling well on the day the shot is scheduled. 4. Risks With any medicine, including vaccines, there is  a chance of side effects. These are usually mild and go away on their own. Serious reactions are also possible but are rare. Most people who get Tdap vaccine do not have any problems with it. Mild problems following Tdap (Did not interfere with activities)  Pain where the shot was given (about 3 in 4  adolescents or 2 in 3 adults)  Redness or swelling where the shot was given (about 1 person in 5)  Mild fever of at least 100.40F (up to about 1 in 25 adolescents or 1 in 100 adults)  Headache (about 3 or 4 people in 10)  Tiredness (about 1 person in 3 or 4)  Nausea, vomiting, diarrhea, stomach ache (up to 1 in 4 adolescents or 1 in 10 adults)  Chills, sore joints (about 1 person in 10)  Body aches (about 1 person in 3 or 4)  Rash, swollen glands (uncommon) Moderate problems following Tdap (Interfered with activities, but did not require medical attention)  Pain where the shot was given (up to 1 in 5 or 6)  Redness or swelling where the shot was given (up to about 1 in 16 adolescents or 1 in 12 adults)  Fever over 102F (about 1 in 100 adolescents or 1 in 250 adults)  Headache (about 1 in 7 adolescents or 1 in 10 adults)  Nausea, vomiting, diarrhea, stomach ache (up to 1 or 3 people in 100)  Swelling of the entire arm where the shot was given (up to about 1 in 500). Severe problems following Tdap (Unable to perform usual activities; required medical attention)  Swelling, severe pain, bleeding and redness in the arm where the shot was given (rare). Problems that could happen after any vaccine:  People sometimes faint after a medical procedure, including vaccination. Sitting or lying down for about 15 minutes can help prevent fainting, and injuries caused by a fall. Tell your doctor if you feel dizzy, or have vision changes or ringing in the ears.  Some people get severe pain in the shoulder and have difficulty moving the arm where a shot was given. This happens very rarely.  Any medication can cause a severe allergic reaction. Such reactions from a vaccine are very rare, estimated at fewer than 1 in a million doses, and would happen within a few minutes to a few hours after the vaccination. As with any medicine, there is a very remote chance of a vaccine causing a serious  injury or death. The safety of vaccines is always being monitored. For more information, visit: http://www.aguilar.org/ 5. What if there is a serious problem? What should I look for?  Look for anything that concerns you, such as signs of a severe allergic reaction, very high fever, or unusual behavior. Signs of a severe allergic reaction can include hives, swelling of the face and throat, difficulty breathing, a fast heartbeat, dizziness, and weakness. These would usually start a few minutes to a few hours after the vaccination. What should I do?  If you think it is a severe allergic reaction or other emergency that can't wait, call 9-1-1 or get the person to the nearest hospital. Otherwise, call your doctor.  Afterward, the reaction should be reported to the Vaccine Adverse Event Reporting System (VAERS). Your doctor might file this report, or you can do it yourself through the VAERS web site at www.vaers.SamedayNews.es, or by calling 302-685-4097. VAERS does not give medical advice. 6. The National Vaccine Injury Compensation Program The [employer] Vaccine Injury Compensation Program ([place]) is  a federal program that was created to compensate people who may have been injured by certain vaccines. Persons who believe they may have been injured by a vaccine can learn about the program and about filing a claim by calling 484-236-3080 or visiting the [place] website at GoldCloset.com.ee. There is a time limit to file a claim for compensation. 7. How can I learn more?  Ask your doctor. He or she can give you the vaccine package insert or suggest other sources of information.  Call your local or state health department.  Contact the Centers for Disease Control and Prevention (CDC): ? Call 231-560-9747 (1-800-CDC-INFO) or ? Visit CDC's website at http://hunter.com/ Vaccine Information Statement Tdap Vaccine (02/18/2014) This information is not intended to replace advice given to you  by your health care provider. Make sure you discuss any questions you have with your health care provider. Document Released: 06/12/2012 Document Revised: 07/30/2018 Document Reviewed: 07/30/2018 Elsevier Interactive Patient Education  2019 [place]? Guide for patients at [facility] [employer]  Why consider waterbirth?  . Gentle birth for babies . Less pain medicine used in labor . May allow for passive descent/less pushing . May reduce perineal tears  . More mobility and instinctive maternal position changes . Increased maternal relaxation . Reduced blood pressure in labor  Is waterbirth safe? What are the risks of infection, drowning or other complications?  . Infection: o Very low risk (3.7 % for tub vs 4.8% for bed) o 7 in 8000 waterbirths with documented infection o Poorly cleaned equipment most common cause o Slightly lower group B strep transmission rate  . Drowning o Maternal:  - Very low risk   - Related to seizures or fainting o Newborn:  - Very low risk. No evidence of increased risk of respiratory problems in multiple large studies - Physiological protection from breathing under water - Avoid underwater birth if there are any fetal complications - Once baby's head is out of the water, keep it out.  . Birth complication o Some reports of cord trauma, but risk decreased by bringing baby to surface gradually o No evidence of increased risk of shoulder dystocia. Mothers can usually change positions faster in water than in a bed, possibly aiding the maneuvers to free the shoulder.   You must attend a Doren Custard class at [facility]  3rd Wednesday of every month from 7-9pm  [employer] by calling [phone] or online at [email or web address]  Bring [place] the certificate from the class to your prenatal appointment  Meet with a midwife at 36 weeks to see if you can still plan a waterbirth and to sign the consent.    If you plan a waterbirth after February 17, 2019, at [facility], you will need to purchase the following:  Fish net  Bathing suit top (optional)  Long-handled mirror (optional)  If you plan a waterbirth before February 17, 2019: Purchase or rent the following supplies: You are responsible for providing all supplies listed above. **If you do not have all necessary supplies you cannot have a waterbirth.**   Water Birth Pool (Birth Pool in a Box or [place] for instance)  (Tubs start ~$125)  Single-use disposable tub liner designed for your brand of tub  Electric drain pump to remove water (We recommend 792 gallon per hour or greater pump.)   New garden hose labeled "lead-free", "suitable for drinking water",  Separate garden hose to remove the dirty water  Fish net  Bathing suit top (optional)  Long-handled mirror (optional)  Places to purchase or rent supplies:   GotWebTools.is for tub purchases and supplies  Waterbirthsolutions.com for tub purchases and supplies  The Labor Ladies (www.thelaborladies.com) $275 for tub rental/set-up & take down/kit   [employer] (http://www.fleming.com/.htm) Information regarding doulas (labor support) who provide pool rentals  Things that would prevent you from having a waterbirth:  Premature, <37wks  Previous cesarean birth  Presence of thick meconium-stained fluid  Multiple gestation (Twins, triplets, etc.)  Uncontrolled diabetes or gestational diabetes requiring medication  Hypertension requiring medication or diagnosis of pre-eclampsia  Heavy vaginal bleeding  Non-reassuring fetal heart rate  Active infection (MRSA, etc.). Group B Strep is NOT a contraindication for waterbirth.  If your labor has to be induced and induction method requires continuous monitoring of the baby's heart rate  Other risks/issues identified by your obstetrical  provider  Please remember that birth is unpredictable. Under certain unforeseeable circumstances your provider may advise against giving birth in the tub. These decisions will be made on a case-by-case basis and with the safety of you and your baby as our highest priority.   Fetal Movement Counts Patient Name: ________________________________________________ Patient Due Date: ____________________ What is a fetal movement count?  A fetal movement count is the number of times that you feel your baby move during a certain amount of time. This may also be called a fetal kick count. A fetal movement count is recommended for every pregnant woman. You may be asked to start counting fetal movements as early as week 28 of your pregnancy. Pay attention to when your baby is most active. You may notice your baby's sleep and wake cycles. You may also notice things that make your baby move more. You should do a fetal movement count:  When your baby is normally most active.  At the same time each day. A good time to count movements is while you are resting, after having something to eat and drink. How do I count fetal movements? 1. Find a quiet, comfortable area. Sit, or lie down on your side. 2. Write down the date, the start time and stop time, and the number of movements that you felt between those two times. Take this information with you to your health care visits. 3. For 2 hours, count kicks, flutters, swishes, rolls, and jabs. You should feel at least 10 movements during 2 hours. 4. You may stop counting after you have felt 10 movements. 5. If you do not feel 10 movements in 2 hours, have something to eat and drink. Then, keep resting and counting for 1 hour. If you feel at least 4 movements during that hour, you may stop counting. Contact a health care provider if:  You feel fewer than 4 movements in 2 hours.  Your baby is not moving like he or she usually does. Date: ____________ Start time:  ____________ Stop time: ____________ Movements: ____________ Date: ____________ Start time: ____________ Stop time: ____________ Movements: ____________ Date: ____________ Start time: ____________ Stop time: ____________ Movements: ____________ Date: ____________ Start time: ____________ Stop time: ____________ Movements: ____________ Date: ____________ Start time: ____________ Stop time: ____________ Movements: ____________ Date: ____________ Start time: ____________ Stop time: ____________ Movements: ____________ Date: ____________ Start time: ____________ Stop time: ____________ Movements: ____________ Date: ____________ Start time: ____________ Stop time: ____________ Movements: ____________ Date: ____________ Start time: ____________ Stop time: ____________ Movements: ____________ This information is not intended to replace advice given to you by your  health care provider. Make sure you discuss any questions you have with your health care provider. Document Released: 01/11/2007 Document Revised: 08/10/2016 Document Reviewed: 01/21/2016 Elsevier Interactive Patient Education  2019 [employer].

## 2019-03-23 LAB — CBC
Hematocrit: 30.4 % — ABNORMAL LOW (ref 34.0–46.6)
Hemoglobin: 9.8 g/dL — ABNORMAL LOW (ref 11.1–15.9)
MCH: 24.3 pg — ABNORMAL LOW (ref 26.6–33.0)
MCHC: 32.2 g/dL (ref 31.5–35.7)
MCV: 75 fL — ABNORMAL LOW (ref 79–97)
PLATELETS: 311 10*3/uL (ref 150–450)
RBC: 4.03 x10E6/uL (ref 3.77–5.28)
RDW: 13.9 % (ref 11.7–15.4)
WBC: 8.7 10*3/uL (ref 3.4–10.8)

## 2019-03-23 LAB — HIV ANTIBODY (ROUTINE TESTING W REFLEX): HIV Screen 4th Generation wRfx: NONREACTIVE

## 2019-03-23 LAB — GLUCOSE TOLERANCE, 2 HOURS W/ 1HR
GLUCOSE, FASTING: 77 mg/dL (ref 65–91)
Glucose, 1 hour: 187 mg/dL — ABNORMAL HIGH (ref 65–179)
Glucose, 2 hour: 113 mg/dL (ref 65–152)

## 2019-03-23 LAB — RPR: RPR Ser Ql: NONREACTIVE

## 2019-03-25 ENCOUNTER — Encounter: Payer: Self-pay | Admitting: Medical

## 2019-03-25 DIAGNOSIS — O24419 Gestational diabetes mellitus in pregnancy, unspecified control: Secondary | ICD-10-CM

## 2019-03-26 ENCOUNTER — Other Ambulatory Visit: Payer: Self-pay

## 2019-03-26 DIAGNOSIS — O24419 Gestational diabetes mellitus in pregnancy, unspecified control: Secondary | ICD-10-CM

## 2019-03-26 MED ORDER — ACCU-CHEK GUIDE W/DEVICE KIT: 1.0000 | PACK | Freq: Four times a day (QID) | 0 refills | Status: DC

## 2019-03-26 MED ORDER — ACCU-CHEK FASTCLIX LANCETS MISC: 1.0000 | Freq: Four times a day (QID) | 12 refills | Status: DC

## 2019-03-26 MED ORDER — GLUCOSE BLOOD VI STRP: ORAL_STRIP | 12 refills | Status: DC

## 2019-03-26 NOTE — Progress Notes (Signed)
Diabetic referral and supplies sent.

## 2019-03-27 ENCOUNTER — Encounter: Payer: Medicaid Other | Attending: Medical | Admitting: *Deleted

## 2019-03-27 ENCOUNTER — Other Ambulatory Visit: Payer: Self-pay

## 2019-03-27 DIAGNOSIS — O24410 Gestational diabetes mellitus in pregnancy, diet controlled: Secondary | ICD-10-CM | POA: Diagnosis not present

## 2019-03-27 NOTE — Progress Notes (Signed)
  Patient was seen on 03/27/2019 at [place] office for Gestational Diabetes self-management. EDD 06/07/2019. Patient states no history of GDM but she states family history with grandparents. Diet history obtained. Patient eats good variety of all food groups. Beverages include water, juice, regular decaffeinated sodas.  She states she works part time for [employer], no more than 5 hours at a time. She states she is physically active and took the stairs to our 4th floor office today. The following learning objectives were met by the patient :   States the definition of Gestational Diabetes  States why dietary management is important in controlling blood glucose  Describes the effects of carbohydrates on blood glucose levels  Demonstrates ability to create a balanced meal plan  Demonstrates carbohydrate counting   States when to check blood glucose levels  Demonstrates proper blood glucose monitoring techniques  States the effect of stress and exercise on blood glucose levels  States the importance of limiting caffeine and abstaining from alcohol and smoking  Plan:  Aim for 3 Carb Choices per meal (45 grams) +/- 1 either way  Aim for 1-2 Carbs per snack Begin reading food labels for Total Carbohydrate of foods If OK with your MD, consider  increasing your activity level by walking, Arm Chair Exercises or other activity daily as tolerated Begin checking BG before breakfast and 2 hours after first bite of breakfast, lunch and dinner as directed by MD  Bring Log Book/Sheet and meter to every medical appointment OR use Baby Scripts (see below) Baby Scripts:  Patient was introduced to [employer] and plans to use as record of BG electronically  Take medication if directed by MD  Patient already has a meter: Accu Chek Guide Patient instructed to test pre breakfast and 2 hours each meal as directed by MD  Patient instructed to monitor glucose levels: FBS: 60 - 95 mg/dl 2 hour: <120  mg/dl  Patient received the following handouts:  Nutrition Diabetes and Pregnancy  Carbohydrate Counting List  BG Log Sheet  Patient will be seen for follow-up as needed.

## 2019-03-28 ENCOUNTER — Other Ambulatory Visit: Payer: Self-pay | Admitting: Obstetrics

## 2019-03-28 DIAGNOSIS — M549 Dorsalgia, unspecified: Secondary | ICD-10-CM

## 2019-03-28 DIAGNOSIS — O339 Maternal care for disproportion, unspecified: Secondary | ICD-10-CM | POA: Diagnosis not present

## 2019-03-28 MED ORDER — COMFORT FIT MATERNITY SUPP SM MISC: 1.0000 "application " | Freq: Every day | 0 refills | Status: DC

## 2019-04-02 ENCOUNTER — Ambulatory Visit (HOSPITAL_COMMUNITY): Payer: Medicaid Other

## 2019-04-04 ENCOUNTER — Ambulatory Visit (INDEPENDENT_AMBULATORY_CARE_PROVIDER_SITE_OTHER): Payer: Medicaid Other | Admitting: Certified Nurse Midwife

## 2019-04-04 ENCOUNTER — Encounter: Payer: Self-pay | Admitting: Certified Nurse Midwife

## 2019-04-04 ENCOUNTER — Other Ambulatory Visit: Payer: Self-pay

## 2019-04-04 VITALS — BP 115/68 | HR 86 | Wt 239.0 lb

## 2019-04-04 DIAGNOSIS — O09523 Supervision of elderly multigravida, third trimester: Secondary | ICD-10-CM

## 2019-04-04 DIAGNOSIS — O99210 Obesity complicating pregnancy, unspecified trimester: Secondary | ICD-10-CM

## 2019-04-04 DIAGNOSIS — Z3480 Encounter for supervision of other normal pregnancy, unspecified trimester: Secondary | ICD-10-CM

## 2019-04-04 DIAGNOSIS — O24410 Gestational diabetes mellitus in pregnancy, diet controlled: Secondary | ICD-10-CM

## 2019-04-04 DIAGNOSIS — O99213 Obesity complicating pregnancy, third trimester: Secondary | ICD-10-CM

## 2019-04-04 DIAGNOSIS — Z3A30 30 weeks gestation of pregnancy: Secondary | ICD-10-CM

## 2019-04-04 NOTE — Progress Notes (Signed)
   PRENATAL VISIT NOTE  Subjective:  Samantha Harris is a 38 y.o. [id number] at [redacted]w[redacted]d being seen today for ongoing prenatal care.  She is currently monitored for the following issues for this high-risk pregnancy and has Panic disorder with agoraphobia and mild panic attacks; Tobacco abuse; Family history of colon cancer requiring screening colonoscopy; Hidradenitis axillaris; Obesity (BMI 30-39.9); Advanced maternal age in multigravida; Supervision of other normal pregnancy, antepartum; Obesity in pregnancy, antepartum; and Gestational diabetes on their problem list.  Patient reports no complaints.  Contractions: Not present. Vag. Bleeding: None.  Movement: Present. Denies leaking of fluid.   The following portions of the patient's history were reviewed and updated as appropriate: allergies, current medications, past family history, past medical history, past social history, past surgical history and problem list.   Objective:   Vitals:   04/04/19 1317  BP: 115/68  Pulse: 86  Weight: 239 lb (108.4 kg)    Fetal Status: Fetal Heart Rate (bpm): 138 Fundal Height: 34 cm Movement: Present     General:  Alert, oriented and cooperative. Patient is in no acute distress.  Skin: Skin is warm and dry. No rash noted.   Cardiovascular: Normal heart rate noted  Respiratory: Normal respiratory effort, no problems with respiration noted  Abdomen: Soft, gravid, appropriate for gestational age.  Pain/Pressure: Present     Pelvic: Cervical exam deferred        Extremities: Normal range of motion.  Edema: None  Mental Status: Normal mood and affect. Normal behavior. Normal judgment and thought content.   Assessment and Plan:  Pregnancy: [id number] at [redacted]w[redacted]d 1. Supervision of other normal pregnancy, antepartum - patient doing well, no complaints - Anticipatory guidance on upcoming appointments with next appointment being telehealth appointment  - Patient reports having BP cuff at home and is currently  enrolled with babyscripts  - COVID19 precautions discussed   2. Obesity in pregnancy, antepartum - TWG 4lb during this pregnancy  - Follow up US for fetal growth scheduled for 4/30  3. Multigravida of advanced maternal age in third trimester - <40 yo, no additional antenatal screening needed   4. Diet controlled gestational diabetes mellitus (GDM) in third trimester - Patient reports she has not been logging CBG in babyscripts as she should, but she has been taking CBGs, has meter with her this appointment - Reviewed CBGs with patient  - Fasting range from 88-92 with 3 elevated of [phone] - patient reports elevated ones due to late night snack once she got off work and does not believe it was over 8 hours to be fasting  - 2hr PP range from 89-111 with no abnormal 2hr PP  - Educated and discussed importance of normal range CBG and risk to fetus with elevation, encouraged to continue modification to diet over next 2 weeks, if continued elevated fasting at next appointment will initiate medication. Patient verbalizes understanding.   Preterm labor symptoms and general obstetric precautions including but not limited to vaginal bleeding, contractions, leaking of fluid and fetal movement were reviewed in detail with the patient. Please refer to After Visit Summary for other counseling recommendations.   Return in about 2 weeks (around 04/18/2019) for [facility].  Future Appointments  Date Time Provider Department Center  04/17/2019  9:15 AM Sharyon Cable, CNM [facility] None  04/25/2019 10:15 AM [facility] [place] 4 [facility]    Sharyon Cable, CNM

## 2019-04-04 NOTE — Patient Instructions (Signed)

## 2019-04-09 ENCOUNTER — Telehealth: Payer: Self-pay

## 2019-04-09 NOTE — Telephone Encounter (Signed)
Called pt after babyrx BG alert. Pt stated that it was her fault because she took her BG directly after eating, in addition to her entering the information incorrectly. Pt denies any abnormal symptoms. Pt states that she has a crazy schedule but will try to do better with BG, advised to call if she needs assistance, pt agreed.

## 2019-04-16 ENCOUNTER — Other Ambulatory Visit: Payer: Self-pay | Admitting: *Deleted

## 2019-04-16 MED ORDER — METRONIDAZOLE 0.75 % VA GEL: 1.0000 | Freq: Two times a day (BID) | VAGINAL | 0 refills | Status: DC

## 2019-04-16 NOTE — Progress Notes (Signed)
See pt email communications. Request metrogel

## 2019-04-17 ENCOUNTER — Ambulatory Visit (INDEPENDENT_AMBULATORY_CARE_PROVIDER_SITE_OTHER): Payer: Medicaid Other | Admitting: Certified Nurse Midwife

## 2019-04-17 ENCOUNTER — Encounter: Payer: Self-pay | Admitting: Certified Nurse Midwife

## 2019-04-17 ENCOUNTER — Other Ambulatory Visit: Payer: Self-pay

## 2019-04-17 VITALS — BP 140/80

## 2019-04-17 DIAGNOSIS — O99213 Obesity complicating pregnancy, third trimester: Secondary | ICD-10-CM

## 2019-04-17 DIAGNOSIS — Z3480 Encounter for supervision of other normal pregnancy, unspecified trimester: Secondary | ICD-10-CM

## 2019-04-17 DIAGNOSIS — O24410 Gestational diabetes mellitus in pregnancy, diet controlled: Secondary | ICD-10-CM

## 2019-04-17 DIAGNOSIS — O99210 Obesity complicating pregnancy, unspecified trimester: Secondary | ICD-10-CM

## 2019-04-17 NOTE — Progress Notes (Signed)
   WEBEX VIRTUAL OBSTETRICS VISIT ENCOUNTER NOTE  I connected with Kaelynn E Brathwaite on 04/17/19 at  9:15 AM EDT by webex at home and verified that I am speaking with the correct person using two identifiers.   I discussed the limitations, risks, security and privacy concerns of performing an evaluation and management service by telephone and the availability of in person appointments. I also discussed with the patient that there may be a patient responsible charge related to this service. The patient expressed understanding and agreed to proceed.  Subjective:  Samantha Harris is a 38 y.o. [phone] at [redacted]w[redacted]d being followed for ongoing prenatal care.  She is currently monitored for the following issues for this high-risk pregnancy and has Panic disorder with agoraphobia and mild panic attacks; Tobacco abuse; Family history of colon cancer requiring screening colonoscopy; Hidradenitis axillaris; Obesity (BMI 30-39.9); Advanced maternal age in multigravida; Supervision of other normal pregnancy, antepartum; Obesity in pregnancy, antepartum; and Gestational diabetes on their problem list.  Patient reports no complaints. Reports fetal movement. Denies any contractions, bleeding or leaking of fluid.   The following portions of the patient's history were reviewed and updated as appropriate: allergies, current medications, past family history, past medical history, past social history, past surgical history and problem list.   Objective:   General:  Alert, oriented and cooperative.   Mental Status: Normal mood and affect perceived. Normal judgment and thought content.  Rest of physical exam deferred due to type of encounter  Assessment and Plan:  Pregnancy: [id number] at [redacted]w[redacted]d 1. Supervision of other normal pregnancy, antepartum - Patient doing well, no complaints. - Anticipatory guidance on upcoming appointments - COVID 19 precautions  - Encouraged to enter BP weekly into babyscripts (patient reports  having manual cuff at home)  2. Obesity in pregnancy, antepartum - follow up US scheduled for 4/30  3. Diet controlled gestational diabetes mellitus (GDM) in third trimester - Fasting: range from 83-91 with one elevated of 94 - 2 hr PP: range from 78-119, with three elevated of 132, 137, and 141 - Discussed with patient importance of making modifications to diet, patient reports she has been snacking d/t being home because of stay at home order. Encouraged patient to modify snack types and how much she snacks   Preterm labor symptoms and general obstetric precautions including but not limited to vaginal bleeding, contractions, leaking of fluid and fetal movement were reviewed in detail with the patient.  I discussed the assessment and treatment plan with the patient. The patient was provided an opportunity to ask questions and all were answered. The patient agreed with the plan and demonstrated an understanding of the instructions. The patient was advised to call back or seek an in-person office evaluation/go to MAU at [facility] for any urgent or concerning symptoms. Please refer to After Visit Summary for other counseling recommendations.   I provided 15 minutes of non-face-to-face time during this encounter.  Return in about 23 days (around 05/10/2019) for ROB/GBS.  Future Appointments  Date Time Provider Department Center  04/25/2019 10:15 AM [facility] [place] 4 [facility]  05/10/2019  8:30 AM Katrinka Blazing, [place], CNM [facility] None    Sharyon Cable, [facility] Center for [employer], [facility]

## 2019-04-17 NOTE — Progress Notes (Signed)
Pt is on the phone preparing for [facility] meeting with provider. [redacted]w[redacted]d. BG levels: Fasting (94) After meals (highest 137) Pt does have babyscripts and BP cuff at home and is able to check BP while on the phone.

## 2019-04-25 ENCOUNTER — Ambulatory Visit (HOSPITAL_COMMUNITY)
Admission: RE | Admit: 2019-04-25 | Discharge: 2019-04-25 | Disposition: A | Discharge status: Home / Self Care | Payer: Medicaid Other | Source: Ambulatory Visit | Attending: Maternal & Fetal Medicine | Admitting: Maternal & Fetal Medicine

## 2019-04-25 ENCOUNTER — Other Ambulatory Visit: Payer: Self-pay

## 2019-04-25 DIAGNOSIS — Z362 Encounter for other antenatal screening follow-up: Secondary | ICD-10-CM | POA: Diagnosis not present

## 2019-04-25 DIAGNOSIS — O09523 Supervision of elderly multigravida, third trimester: Secondary | ICD-10-CM | POA: Diagnosis not present

## 2019-05-08 ENCOUNTER — Telehealth: Payer: Self-pay

## 2019-05-08 ENCOUNTER — Other Ambulatory Visit (HOSPITAL_COMMUNITY)
Admission: RE | Admit: 2019-05-08 | Discharge: 2019-05-08 | Disposition: A | Discharge status: Home / Self Care | Payer: Medicaid Other | Source: Ambulatory Visit | Attending: Advanced Practice Midwife | Admitting: Advanced Practice Midwife

## 2019-05-08 DIAGNOSIS — Z362 Encounter for other antenatal screening follow-up: Secondary | ICD-10-CM | POA: Insufficient documentation

## 2019-05-08 NOTE — Patient Instructions (Signed)
Braxton Hicks Contractions Contractions of the uterus can occur throughout pregnancy, but they are not always a sign that you are in labor. You may have practice contractions called Braxton Hicks contractions. These false labor contractions are sometimes confused with true labor. What are Braxton Hicks contractions? Braxton Hicks contractions are tightening movements that occur in the muscles of the uterus before labor. Unlike true labor contractions, these contractions do not result in opening (dilation) and thinning of the cervix. Toward the end of pregnancy (32-34 weeks), Braxton Hicks contractions can happen more often and may become stronger. These contractions are sometimes difficult to tell apart from true labor because they can be very uncomfortable. You should not feel embarrassed if you go to the hospital with false labor. Sometimes, the only way to tell if you are in true labor is for your health care provider to look for changes in the cervix. The health care provider will do a physical exam and may monitor your contractions. If you are not in true labor, the exam should show that your cervix is not dilating and your water has not broken. If there are no other health problems associated with your pregnancy, it is completely safe for you to be sent home with false labor. You may continue to have Braxton Hicks contractions until you go into true labor. How to tell the difference between true labor and false labor True labor  Contractions last 30-70 seconds.  Contractions become very regular.  Discomfort is usually felt in the top of the uterus, and it spreads to the lower abdomen and low back.  Contractions do not go away with walking.  Contractions usually become more intense and increase in frequency.  The cervix dilates and gets thinner. False labor  Contractions are usually shorter and not as strong as true labor contractions.  Contractions are usually irregular.  Contractions  are often felt in the front of the lower abdomen and in the groin.  Contractions may go away when you walk around or change positions while lying down.  Contractions get weaker and are shorter-lasting as time goes on.  The cervix usually does not dilate or become thin. Follow these instructions at home:   Take over-the-counter and prescription medicines only as told by your health care provider.  Keep up with your usual exercises and follow other instructions from your health care provider.  Eat and drink lightly if you think you are going into labor.  If Braxton Hicks contractions are making you uncomfortable: ? Change your position from lying down or resting to walking, or change from walking to resting. ? Sit and rest in a tub of warm water. ? Drink enough fluid to keep your urine pale yellow. Dehydration may cause these contractions. ? Do slow and deep breathing several times an hour.  Keep all follow-up prenatal visits as told by your health care provider. This is important. Contact a health care provider if:  You have a fever.  You have continuous pain in your abdomen. Get help right away if:  Your contractions become stronger, more regular, and closer together.  You have fluid leaking or gushing from your vagina.  You pass blood-tinged mucus (bloody show).  You have bleeding from your vagina.  You have low back pain that you never had before.  You feel your baby's head pushing down and causing pelvic pressure.  Your baby is not moving inside you as much as it used to. Summary  Contractions that occur before labor are   called Braxton Hicks contractions, false labor, or practice contractions.  Braxton Hicks contractions are usually shorter, weaker, farther apart, and less regular than true labor contractions. True labor contractions usually become progressively stronger and regular, and they become more frequent.  Manage discomfort from Braxton Hicks contractions  by changing position, resting in a warm bath, drinking plenty of water, or practicing deep breathing. This information is not intended to replace advice given to you by your health care provider. Make sure you discuss any questions you have with your health care provider. Document Released: 04/27/2017 Document Revised: 09/26/2017 Document Reviewed: 04/27/2017 Elsevier Interactive Patient Education  2019 [employer]vier Inc.  

## 2019-05-08 NOTE — Telephone Encounter (Signed)
S/w pt about not having BG logs in babyrx, pt states that she has been keeping a log on her device, but will put them into the app when she gets a chance. Pt does not want to be removed from babyrx.

## 2019-05-08 NOTE — Progress Notes (Signed)
   PRENATAL VISIT NOTE  Subjective:  Samantha Harris is a 38 y.o. [id number] at [redacted]w[redacted]d being seen today for ongoing prenatal care.  She is currently monitored for the following issues for this high-risk pregnancy and has Panic disorder with agoraphobia and mild panic attacks; Tobacco abuse; Family history of colon cancer requiring screening colonoscopy; Hidradenitis axillaris; Obesity (BMI 30-39.9); Advanced maternal age in multigravida; Supervision of high risk pregnancy, antepartum; Obesity in pregnancy, antepartum; and Gestational diabetes on their problem list.  Patient reports occasional contractions.  Contractions: Irritability. Vag. Bleeding: None.  Movement: Present. Denies leaking of fluid.   The following portions of the patient's history were reviewed and updated as appropriate: allergies, current medications, past family history, past medical history, past social history, past surgical history and problem list.   Objective:   Vitals:   05/10/19 0827  BP: 129/74  Pulse: 92  Weight: 247 lb 6.4 oz (112.2 kg)    Fetal Status: Fetal Heart Rate (bpm): 135   Movement: Present     General:  Alert, oriented and cooperative. Patient is in no acute distress.  Skin: Skin is warm and dry. No rash noted.   Cardiovascular: Normal heart rate noted  Respiratory: Normal respiratory effort, no problems with respiration noted  Abdomen: Soft, gravid, appropriate for gestational age.  Pain/Pressure: Present     Pelvic: Cervical exam performed        Extremities: Normal range of motion.  Edema: None  Mental Status: Normal mood and affect. Normal behavior. Normal judgment and thought content.   Only one out of range.    Assessment and Plan:  Pregnancy: [id number] at [redacted]w[redacted]d 1. Diet controlled gestational diabetes mellitus (GDM) in third trimester - Encouraged to write down CBG's. Log given. - [place] MFM OB FOLLOW UP; Future  2. Obesity (BMI 30-39.9)  - [place] MFM OB FOLLOW UP; Future  3. Obesity in  pregnancy, antepartum  - [place] MFM OB FOLLOW UP; Future  4. Supervision of high risk pregnancy, antepartum  - [place] MFM OB FOLLOW UP; Future  5. Tobacco abuse  - [place] MFM OB FOLLOW UP; Future  6. Encounter for other antenatal screening follow-up  - Culture, beta strep (group b only) - GC/Chlamydia probe amp ([place])not at [facility] - [place] MFM OB FOLLOW UP; Future  Preterm labor symptoms and general obstetric precautions including but not limited to vaginal bleeding, contractions, leaking of fluid and fetal movement were reviewed in detail with the patient. Please refer to After Visit Summary for other counseling recommendations.   Return in about 1 week (around 05/17/2019) for ROB.   Dorathy Kinsman, CNM

## 2019-05-10 ENCOUNTER — Other Ambulatory Visit: Payer: Self-pay

## 2019-05-10 ENCOUNTER — Encounter: Payer: Self-pay | Admitting: Advanced Practice Midwife

## 2019-05-10 ENCOUNTER — Ambulatory Visit (INDEPENDENT_AMBULATORY_CARE_PROVIDER_SITE_OTHER): Payer: Medicaid Other | Admitting: Advanced Practice Midwife

## 2019-05-10 VITALS — BP 129/74 | HR 92 | Wt 247.4 lb

## 2019-05-10 DIAGNOSIS — Z3A36 36 weeks gestation of pregnancy: Secondary | ICD-10-CM

## 2019-05-10 DIAGNOSIS — O24410 Gestational diabetes mellitus in pregnancy, diet controlled: Secondary | ICD-10-CM

## 2019-05-10 DIAGNOSIS — E669 Obesity, unspecified: Secondary | ICD-10-CM

## 2019-05-10 DIAGNOSIS — Z362 Encounter for other antenatal screening follow-up: Secondary | ICD-10-CM

## 2019-05-10 DIAGNOSIS — O99210 Obesity complicating pregnancy, unspecified trimester: Secondary | ICD-10-CM

## 2019-05-10 DIAGNOSIS — O0993 Supervision of high risk pregnancy, unspecified, third trimester: Secondary | ICD-10-CM

## 2019-05-10 DIAGNOSIS — O0990 Supervision of high risk pregnancy, unspecified, unspecified trimester: Secondary | ICD-10-CM

## 2019-05-10 DIAGNOSIS — O99213 Obesity complicating pregnancy, third trimester: Secondary | ICD-10-CM

## 2019-05-10 DIAGNOSIS — Z720 Tobacco use: Secondary | ICD-10-CM

## 2019-05-13 LAB — GC/CHLAMYDIA PROBE AMP (~~LOC~~) NOT AT ARMC
Chlamydia: NEGATIVE
Neisseria Gonorrhea: NEGATIVE

## 2019-05-14 LAB — CULTURE, BETA STREP (GROUP B ONLY): Strep Gp B Culture: NEGATIVE

## 2019-05-17 ENCOUNTER — Ambulatory Visit (INDEPENDENT_AMBULATORY_CARE_PROVIDER_SITE_OTHER): Payer: Medicaid Other | Admitting: Obstetrics

## 2019-05-17 ENCOUNTER — Other Ambulatory Visit: Payer: Self-pay

## 2019-05-17 ENCOUNTER — Encounter: Payer: Self-pay | Admitting: Obstetrics

## 2019-05-17 VITALS — BP 130/90

## 2019-05-17 DIAGNOSIS — Z3A37 37 weeks gestation of pregnancy: Secondary | ICD-10-CM

## 2019-05-17 DIAGNOSIS — O09523 Supervision of elderly multigravida, third trimester: Secondary | ICD-10-CM

## 2019-05-17 DIAGNOSIS — O99210 Obesity complicating pregnancy, unspecified trimester: Secondary | ICD-10-CM

## 2019-05-17 DIAGNOSIS — O0993 Supervision of high risk pregnancy, unspecified, third trimester: Secondary | ICD-10-CM

## 2019-05-17 DIAGNOSIS — O99213 Obesity complicating pregnancy, third trimester: Secondary | ICD-10-CM

## 2019-05-17 DIAGNOSIS — O0990 Supervision of high risk pregnancy, unspecified, unspecified trimester: Secondary | ICD-10-CM

## 2019-05-17 DIAGNOSIS — O24410 Gestational diabetes mellitus in pregnancy, diet controlled: Secondary | ICD-10-CM

## 2019-05-17 NOTE — Progress Notes (Signed)
Pt is on the phone preparing for Webex visit with provider. [redacted]w[redacted]d. Pt has been checking BG levels at home: Fasting (highest 92) after meals (highest 111).

## 2019-05-17 NOTE — Progress Notes (Signed)
TELEHEALTH OBSTETRICS PRENATAL VIRTUAL VIDEO VISIT ENCOUNTER NOTE  [facility]   I connected with Samantha Harris on 05/17/19 at  9:30 AM EDT by WebEx OB MyChart Video Encounter at home and verified that I am speaking with the correct person using two identifiers.   I discussed the limitations, risks, security and privacy concerns of performing an evaluation and management service by telephone and the availability of in person appointments. I also discussed with the patient that there may be a patient responsible charge related to this service. The patient expressed understanding and agreed to proceed. Subjective:  Samantha Harris is a 38 y.o. [id number] at 5971w0d being seen today for ongoing prenatal care.  She is currently monitored for the following issues for this high-risk pregnancy and has Panic disorder with agoraphobia and mild panic attacks; Tobacco abuse; Family history of colon cancer requiring screening colonoscopy; Hidradenitis axillaris; Obesity (BMI 30-39.9); Advanced maternal age in multigravida; Supervision of high risk pregnancy, antepartum; Obesity in pregnancy, antepartum; and Gestational diabetes on their problem list.  Patient reports no complaints.  Contractions: Irritability. Vag. Bleeding: None.  Movement: Present. Denies any leaking of fluid.   The following portions of the patient's history were reviewed and updated as appropriate: allergies, current medications, past family history, past medical history, past social history, past surgical history and problem list.   Objective:   Vitals:   05/17/19 0925  BP: 130/90    Fetal Status:     Movement: Present     General:  Alert, oriented and cooperative. Patient is in no acute distress.  Respiratory: Normal respiratory effort, no problems with respiration noted  Mental Status: Normal mood and affect. Normal behavior. Normal judgment and thought content.  Rest of physical  exam deferred due to type of encounter  Imaging: [place] Mfm Ob Follow Up  Result Date: 04/25/2019 ----------------------------------------------------------------------  OBSTETRICS REPORT                       (Signed Final 04/25/2019 10:59 am) ---------------------------------------------------------------------- Patient Info  ID #:       [id number]                          D.O.B.:  1981-12-15 (37 yrs)  Name:       Samantha Harris                Visit Date: 04/25/2019 10:23 am ---------------------------------------------------------------------- Performed By  Performed By:     Percell BostonHeather Waken          Ref. Address:     [address]                    [address]                                                             Road [place],  [place] [address]  Attending:        Noralee Space MD        [facility]  Referred By:      Marny Lowenstein                    [place] ---------------------------------------------------------------------- Orders   #  Description                          Code         Ordered By   1  [place] MFM OB FOLLOW UP                  81840.37     Lin Landsman  ----------------------------------------------------------------------   #  Order #                    Accession #                 Episode #   1  [id number]                  [id number]                  [id number]  ---------------------------------------------------------------------- Indications   [redacted] weeks gestation of pregnancy                Z3A.33   Encounter for other antenatal screening        Z36.2   follow-up  ---------------------------------------------------------------------- Fetal Evaluation  Num Of Fetuses:         1  Fetal Heart Rate(bpm):  123  Cardiac Activity:       Observed  Presentation:           Cephalic  Placenta:                Posterior  P. Cord Insertion:      Visualized, central  Amniotic Fluid  AFI FV:      Within normal limits  AFI Sum(cm)     %Tile       Largest Pocket(cm)  9.29            14          3.12  RUQ(cm)       RLQ(cm)       LUQ(cm)        LLQ(cm)  3.12          2.58          1.94           1.65 ---------------------------------------------------------------------- Biometry  BPD:      80.3  mm     G. Age:  32w 2d  9  %    CI:        74.15   %    70 - 86                                                          FL/HC:      20.9   %    19.4 - 21.8  HC:      296.1  mm     G. Age:  32w 5d          4  %    HC/AC:      1.00        0.96 - 1.11  AC:       297   mm     G. Age:  33w 5d         48  %    FL/BPD:     77.2   %    71 - 87  FL:         62  mm     G. Age:  32w 1d          8  %    FL/AC:      20.9   %    20 - 24  Est. FW:    2098  gm    4 lb 10 oz      43  % ---------------------------------------------------------------------- OB History  Gravidity:    6         Term:   3  TOP:          2        Living:  3 ---------------------------------------------------------------------- Gestational Age  U/S Today:     32w 5d                                        EDD:   06/15/19  Best:          33w 6d     Det. By:  Previous Ultrasound      EDD:   06/07/19 ---------------------------------------------------------------------- Anatomy  Cranium:               Appears normal         Aortic Arch:            Previously seen  Cavum:                 Previously seen        Ductal Arch:            Not well visualized  Ventricles:            Previously seen        Diaphragm:              Previously seen  Choroid Plexus:        Previously seen        Stomach:                Appears normal, left  sided  Cerebellum:            Previously seen        Abdomen:                Previously seen  Posterior Fossa:       Previously seen        Abdominal Wall:         Previously  seen  Nuchal Fold:           Not applicable (>20    Cord Vessels:           Previously seen                         wks GA)  Face:                  Orbits and profile     Kidneys:                Appear normal                         previously seen  Lips:                  Previously seen        Bladder:                Appears normal  Thoracic:              Appears normal         Spine:                  Appears normal  Heart:                 Not well visualized    Upper Extremities:      Previously seen  RVOT:                  Appears normal         Lower Extremities:      Previously seen  LVOT:                  Appears normal  Other:  Female gender. Nasal bone visualized previously. ---------------------------------------------------------------------- Cervix Uterus Adnexa  Cervix  Not visualized (advanced GA >24wks)  Uterus  No abnormality visualized.  Left Ovary  No adnexal mass visualized.  Right Ovary  No adnexal mass visualized.  Cul De Sac  No free fluid seen.  Adnexa  No abnormality visualized. ---------------------------------------------------------------------- Impression  Patient returned for completion of fetal anatomy.  Amniotic fluid is normal and good fetal activity is seen. Fetal  growth is appropriate for gestational age. Cardiac anatomy  appears normal. 4-chamber view could not be assessed  because of fetal position. ---------------------------------------------------------------------- Recommendations  Follow-up as clinically indicated. ----------------------------------------------------------------------                  Noralee Space, MD Electronically Signed Final Report   04/25/2019 10:59 am ----------------------------------------------------------------------   Assessment and Plan:  Pregnancy: [id number] at [redacted]w[redacted]d 1. Supervision of high risk pregnancy, antepartum  2. Multigravida of advanced maternal age in third trimester  3. Diet controlled gestational diabetes mellitus (GDM) in third  trimester  4. Obesity in pregnancy, antepartum   Term labor symptoms and general obstetric precautions including but not limited to vaginal bleeding, contractions, leaking of fluid and fetal movement were reviewed in detail with the patient.  I discussed the assessment and treatment plan with the patient. The patient was provided an opportunity to ask questions and all were answered. The patient agreed with the plan and demonstrated an understanding of the instructions. The patient was advised to call back or seek an in-person office evaluation/go to MAU at [facility] for any urgent or concerning symptoms. Please refer to After Visit Summary for other counseling recommendations.   I provided 10 minutes of face-to-face time during this encounter.  Return in about 1 week (around 05/24/2019) for [facility].  Future Appointments  Date Time Provider Department Center  05/24/2019 10:55 AM Katrinka Blazing, [place], CNM [facility] None  05/24/2019 12:45 PM [facility]-MFC [place] 2 WH-MFCUS [facility]-US    Coral Ceo, MD Center for [facility], [facility] 05-17-2019

## 2019-05-21 DIAGNOSIS — Z3483 Encounter for supervision of other normal pregnancy, third trimester: Secondary | ICD-10-CM

## 2019-05-23 ENCOUNTER — Other Ambulatory Visit: Payer: Self-pay

## 2019-05-23 ENCOUNTER — Encounter (HOSPITAL_COMMUNITY): Payer: Self-pay | Admitting: Emergency Medicine

## 2019-05-23 ENCOUNTER — Ambulatory Visit (HOSPITAL_COMMUNITY)
Admission: EM | Admit: 2019-05-23 | Discharge: 2019-05-23 | Disposition: A | Discharge status: Home / Self Care | Payer: Medicaid Other | Attending: Internal Medicine | Admitting: Internal Medicine

## 2019-05-23 DIAGNOSIS — H6982 Other specified disorders of Eustachian tube, left ear: Secondary | ICD-10-CM

## 2019-05-23 MED ORDER — FLUTICASONE PROPIONATE 50 MCG/ACT NA SUSP: 1.0000 | Freq: Every day | NASAL | 2 refills | Status: DC

## 2019-05-23 NOTE — ED Triage Notes (Signed)
Left ear popped 2 days ago.  Left ear is stuffy, difficulty hearing

## 2019-05-23 NOTE — Discharge Instructions (Signed)
Use 2 sprays in left nare, 1 in right for relief of swelling. Return if you develop fever, eye pain, hearing loss, dizziness, or ear discharge.

## 2019-05-23 NOTE — ED Provider Notes (Signed)
[place]    CSN: [id number] Arrival date & time: 05/23/19  1303     History   Chief Complaint Chief Complaint  Patient presents with  . Ear Problem    HPI Samantha Harris is a 38 y.o. female who is 37 weeks, 6 days gestation, presenting for left ear pressure and popping.  Patient states this is been going on for the last 2 days.  States that hearing is muffled at times.  Denies fever, headache, change in vision, ear pain/discharge, tinnitus, dizziness, nasal congestion, sinus pressure, sore throat, cough, shortness of breath.    Past Medical History:  Diagnosis Date  . Anxiety   . Gestational diabetes   . HPV in female   . Migraine     Patient Active Problem List   Diagnosis Date Noted  . Gestational diabetes 03/25/2019  . Obesity in pregnancy, antepartum 02/01/2019  . Supervision of high risk pregnancy, antepartum 01/29/2019  . Advanced maternal age in multigravida 10/30/2018  . Obesity (BMI 30-39.9) 06/12/2012  . Family history of colon cancer requiring screening colonoscopy 03/06/2012  . Hidradenitis axillaris 03/06/2012  . Panic disorder with agoraphobia and mild panic attacks 11/28/2011  . Tobacco abuse 11/28/2011    Past Surgical History:  Procedure Laterality Date  . MOUTH SURGERY      OB History    Gravida  6   Para  3   Term  3   Preterm      AB  2   Living  3     SAB      TAB  2   Ectopic      Multiple      Live Births  3            Home Medications    Prior to Admission medications   Medication Sig Start Date End Date Taking? Authorizing Provider  BABY ASPIRIN PO Baby Aspirin   Yes [provider]  famotidine (PEPCID) 20 MG tablet Take 1 tablet (20 mg total) by mouth 2 (two) times daily. 01/29/19  Yes Marcille Buffy D, CNM  ferrous sulfate 325 (65 FE) MG tablet Take 325 mg by mouth daily with breakfast.   Yes [provider]  ondansetron (ZOFRAN ODT) 4 MG disintegrating tablet Take 1 tablet  (4 mg total) by mouth every 8 (eight) hours as needed for nausea or vomiting. 01/29/19  Yes Marcille Buffy D, CNM  polyethylene glycol [facility]) packet Take 17 g by mouth daily. 01/29/19  Yes Tresea Mall, CNM  Prenatal Vit-Fe Fumarate-FA (PRENATAL VITAMINS PO) Take by mouth.   Yes [provider]  Accu-Chek FastClix Lancets MISC 1 Device by Percutaneous route 4 (four) times daily. 03/26/19   Luvenia Redden, PA-C  acetaminophen (TYLENOL) 500 MG tablet Take 500 mg by mouth as needed.    [provider]  Blood Glucose Monitoring Suppl (ACCU-CHEK GUIDE) w/Device KIT 1 Device by Does not apply route 4 (four) times daily. 03/26/19   Luvenia Redden, PA-C  Elastic Bandages & Supports (COMFORT FIT MATERNITY SUPP LG) MISC 1 Units by Does not apply route daily as needed. 03/22/19   Luvenia Redden, PA-C  Elastic Bandages & Supports (COMFORT FIT MATERNITY SUPP SM) MISC 1 application by Does not apply route daily. 03/28/19   Shelly Bombard, MD  fluticasone (FLONASE) 50 MCG/ACT nasal spray Place 1 spray into both nostrils daily. 05/23/19   Hall-Potvin, [place], PA-C  glucose blood (ACCU-CHEK GUIDE) test strip Use  to check blood sugars four times a day was instructed 03/26/19   Luvenia Redden, PA-C  Prenatal-DSS-FeCb-FeGl-FA (CITRANATAL BLOOM) 90-1 MG TABS TK 1 T PO  QD. 12/31/18   [provider]    Family History Family History  Problem Relation Age of Onset  . Colon cancer Mother 50  . Heart disease Father   . Hypertension Father   . Diabetes Sister   . Asthma Brother   . Diabetes Paternal Grandmother   . Stroke Paternal Grandmother   . Clotting disorder Paternal Grandmother     Social History Social History   Tobacco Use  . Smoking status: Former Smoker    Types: Cigarettes  . Smokeless tobacco: Never Used  Substance Use Topics  . Alcohol use: No    Comment: before pregnancy  . Drug use: No    Comment: past marijuana     Allergies   Patient has no known  allergies.   Review of Systems Review of Systems as per HPI   Physical Exam Triage Vital Signs ED Triage Vitals  Enc Vitals Group     BP 05/23/19 1319 111/76     Pulse Rate 05/23/19 1319 95     Resp 05/23/19 1319 18     Temp 05/23/19 1319 97.8 F (36.6 C)     Temp Source 05/23/19 1319 Oral     SpO2 05/23/19 1319 97 %     Weight --      Height --      Head Circumference --      Peak Flow --      Pain Score 05/23/19 1316 0     Pain Loc --      Pain Edu? --      Excl. in [place]? --    No data found.  Updated Vital Signs BP 111/76 (BP Location: Left Arm) Comment (BP Location): large cuff  Pulse 95   Temp 97.8 F (36.6 C) (Oral)   Resp 18   LMP 08/04/2018   SpO2 97%   Visual Acuity Right Eye Distance:   Left Eye Distance:   Bilateral Distance:    Right Eye Near:   Left Eye Near:    Bilateral Near:     Physical Exam Constitutional:      General: She is not in acute distress.    Appearance: Normal appearance.  HENT:     Head: Normocephalic and atraumatic.     Right Ear: Tympanic membrane, ear canal and external ear normal. There is no impacted cerumen.     Left Ear: Tympanic membrane, ear canal and external ear normal. There is no impacted cerumen.     Nose:     Comments: Bilateral turbinate swelling (left greater than right) with scant mucoid discharge    Mouth/Throat:     Mouth: Mucous membranes are moist.     Pharynx: Oropharynx is clear. No oropharyngeal exudate or posterior oropharyngeal erythema.  Eyes:     General:        Right eye: No discharge.        Left eye: No discharge.     Conjunctiva/sclera: Conjunctivae normal.     Pupils: Pupils are equal, round, and reactive to light.  Neck:     Musculoskeletal: Neck supple. No muscular tenderness.  Cardiovascular:     Rate and Rhythm: Normal rate.  Pulmonary:     Effort: Pulmonary effort is normal.  Lymphadenopathy:     Cervical: No cervical adenopathy.  Skin:  General: Skin is warm.     Capillary  Refill: Capillary refill takes less than 2 seconds.     Coloration: Skin is not jaundiced.  Neurological:     Mental Status: She is alert.      UC Treatments / Results  Labs (all labs ordered are listed, but only abnormal results are displayed) Labs Reviewed - No data to display  EKG None  Radiology No results found.  Procedures Procedures (including critical care time)  Medications Ordered in UC Medications - No data to display  Initial Impression / Assessment and Plan / UC Course  I have reviewed the triage vital signs and the nursing notes.  Pertinent labs & imaging results that were available during my care of the patient were reviewed by me and considered in my medical decision making (see chart for details).     38 year old female who was 37 weeks, 6 days gestation presenting for left ear discomfort.  Exam reassuring, consistent with eustachian tube dysfunction.  Patient to trial intranasal corticosteroid daily.  Return precautions discussed, patient verbalized understanding. Final Clinical Impressions(s) / UC Diagnoses   Final diagnoses:  Eustachian tube dysfunction, left     Discharge Instructions     Use 2 sprays in left nare, 1 in right for relief of swelling. Return if you develop fever, eye pain, hearing loss, dizziness, or ear discharge.    ED Prescriptions    Medication Sig Dispense Auth. Provider   fluticasone (FLONASE) 50 MCG/ACT nasal spray Place 1 spray into both nostrils daily. 16 g Hall-Potvin, [place], PA-C     Controlled Substance Prescriptions [place] Controlled Substance Registry consulted? Not Applicable   Quincy Sheehan, [place] 05/23/19 1430

## 2019-05-24 ENCOUNTER — Encounter: Payer: Self-pay | Admitting: Advanced Practice Midwife

## 2019-05-24 ENCOUNTER — Other Ambulatory Visit: Payer: Medicaid Other

## 2019-05-24 ENCOUNTER — Ambulatory Visit (HOSPITAL_COMMUNITY)
Admission: RE | Admit: 2019-05-24 | Discharge: 2019-05-24 | Disposition: A | Discharge status: Home / Self Care | Payer: Medicaid Other | Source: Ambulatory Visit | Attending: Obstetrics and Gynecology | Admitting: Obstetrics and Gynecology

## 2019-05-24 ENCOUNTER — Other Ambulatory Visit: Payer: Self-pay

## 2019-05-24 ENCOUNTER — Ambulatory Visit (INDEPENDENT_AMBULATORY_CARE_PROVIDER_SITE_OTHER): Payer: Medicaid Other | Admitting: Advanced Practice Midwife

## 2019-05-24 ENCOUNTER — Other Ambulatory Visit: Payer: Self-pay | Admitting: Advanced Practice Midwife

## 2019-05-24 VITALS — BP 130/70

## 2019-05-24 DIAGNOSIS — O99210 Obesity complicating pregnancy, unspecified trimester: Secondary | ICD-10-CM

## 2019-05-24 DIAGNOSIS — O0993 Supervision of high risk pregnancy, unspecified, third trimester: Secondary | ICD-10-CM | POA: Diagnosis not present

## 2019-05-24 DIAGNOSIS — O99213 Obesity complicating pregnancy, third trimester: Secondary | ICD-10-CM

## 2019-05-24 DIAGNOSIS — O163 Unspecified maternal hypertension, third trimester: Secondary | ICD-10-CM

## 2019-05-24 DIAGNOSIS — Z720 Tobacco use: Secondary | ICD-10-CM | POA: Diagnosis not present

## 2019-05-24 DIAGNOSIS — O0990 Supervision of high risk pregnancy, unspecified, unspecified trimester: Secondary | ICD-10-CM | POA: Diagnosis present

## 2019-05-24 DIAGNOSIS — E669 Obesity, unspecified: Secondary | ICD-10-CM

## 2019-05-24 DIAGNOSIS — Z362 Encounter for other antenatal screening follow-up: Secondary | ICD-10-CM | POA: Diagnosis not present

## 2019-05-24 DIAGNOSIS — Z3A38 38 weeks gestation of pregnancy: Secondary | ICD-10-CM

## 2019-05-24 DIAGNOSIS — O09523 Supervision of elderly multigravida, third trimester: Secondary | ICD-10-CM

## 2019-05-24 DIAGNOSIS — O24410 Gestational diabetes mellitus in pregnancy, diet controlled: Secondary | ICD-10-CM

## 2019-05-24 NOTE — Progress Notes (Signed)
Pt getting Pre-E labs. Will call pt if Dx Pre-E and plan IOL. BP check 05/27/19.   Katrinka Blazing, [place], CNM 05/24/2019 12:20 PM

## 2019-05-24 NOTE — Progress Notes (Signed)
Pt is on the phone preparing for virtual visit with provider. [redacted]w[redacted]d. Pt has been checking her BG levels at home: fasting (highest 97) after meals (highest 111)

## 2019-05-24 NOTE — Progress Notes (Signed)
I connected with@ on 05/24/19 at 10:55 AM EDT by: WebEx and verified that I am speaking with the correct person using two identifiers.  Patient is located at home and provider is located at [facility].     The purpose of this virtual visit is to provide medical care while limiting exposure to the novel coronavirus. I discussed the limitations, risks, security and privacy concerns of performing an evaluation and management service by [facility] and the availability of in person appointments. I also discussed with the patient that there may be a patient responsible charge related to this service. By engaging in this virtual visit, you consent to the provision of healthcare.  Additionally, you authorize for your insurance to be billed for the services provided during this visit.  The patient expressed understanding and agreed to proceed.  The following staff members participated in the virtual visit:  none    PRENATAL VISIT NOTE  Subjective:  Samantha Harris is a 38 y.o. [id number] at [redacted]w[redacted]d  for phone visit for ongoing prenatal care.  She is currently monitored for the following issues for this high-risk pregnancy and has Panic disorder with agoraphobia and mild panic attacks; Tobacco abuse; Family history of colon cancer requiring screening colonoscopy; Hidradenitis axillaris; Obesity (BMI 30-39.9); Advanced maternal age in multigravida; Supervision of high risk pregnancy, antepartum; Obesity in pregnancy, antepartum; and Gestational diabetes on their problem list.  Patient reports occasional contractions.  Contractions: Irregular. Vag. Bleeding: None.  Movement: Present. Denies leaking of fluid.   Denies HA, vision changes or epigastric pain. Has had some near-syncopal episodes that upon further questioning sound positional.    The following portions of the patient's history were reviewed and updated as appropriate: allergies, current medications, past family history, past medical history, past social  history, past surgical history and problem list.   Objective:   Vitals:   05/24/19 1102 05/24/19 1141  BP: 130/90 130/70   Self-Obtained  Fetal Status:     Movement: Present     Assessment and Plan:  Pregnancy: [id number] at [redacted]w[redacted]d 1. Hypertension affecting pregnancy, third trimester - Lab closing a [facility] in a few minutes. Has [place] at [facility] in 45 minutes. Will be able to draw Pre-E labs there.  - Explained that if labs show Pre-E I will call her to come in for IOL. If no evidence of Pre-E she will have virtual BP check Monday 05/27/19.  - Pre-E precautions,.   2. Multigravida of advanced maternal age in third trimester   3. Obesity (BMI 30-39.9)   4. Obesity in pregnancy, antepartum   5. Supervision of high risk pregnancy, antepartum - F/U BP check 05/27/19 and ROB in 1 week if not delivered  Term labor symptoms and general obstetric precautions including but not limited to vaginal bleeding, contractions, leaking of fluid and fetal movement were reviewed in detail with the patient.  No follow-ups on file.  Future Appointments  Date Time Provider Department Center  05/24/2019 12:45 PM [facility] [place] 2 [facility]   To [facility] to Pre-E labs  Time spent on virtual visit: 15 minutes  Dorathy Kinsman, [place]

## 2019-05-25 LAB — COMPREHENSIVE METABOLIC PANEL
ALT: 7 IU/L (ref 0–32)
AST: 10 IU/L (ref 0–40)
Albumin/Globulin Ratio: 1.3 (ref 1.2–2.2)
Albumin: 3.3 g/dL — ABNORMAL LOW (ref 3.8–4.8)
Alkaline Phosphatase: 97 IU/L (ref 39–117)
BUN/Creatinine Ratio: 10 (ref 9–23)
BUN: 7 mg/dL (ref 6–20)
Bilirubin Total: <0.2 mg/dL (ref 0.0–1.2)
CO2: 20 mmol/L (ref 20–29)
Calcium: 9 mg/dL (ref 8.7–10.2)
Chloride: 103 mmol/L (ref 96–106)
Creatinine, Ser: 0.71 mg/dL (ref 0.57–1.00)
GFR calc Af Amer: 127 mL/min/{1.73_m2} (ref >59)
GFR calc non Af Amer: 110 mL/min/{1.73_m2} (ref >59)
Globulin, Total: 2.6 g/dL (ref 1.5–4.5)
Glucose: 108 mg/dL — ABNORMAL HIGH (ref 65–99)
Potassium: 4.1 mmol/L (ref 3.5–5.2)
Sodium: 139 mmol/L (ref 134–144)
Total Protein: 5.9 g/dL — ABNORMAL LOW (ref 6.0–8.5)

## 2019-05-25 LAB — PROTEIN / CREATININE RATIO, URINE
Creatinine, Urine: 123.8 mg/dL
Protein, Ur: 31 mg/dL
Protein/Creat Ratio: 250 mg/g creat — ABNORMAL HIGH (ref 0–200)

## 2019-05-25 LAB — CBC
Hematocrit: 31.4 % — ABNORMAL LOW (ref 34.0–46.6)
Hemoglobin: 10 g/dL — ABNORMAL LOW (ref 11.1–15.9)
MCH: 23.3 pg — ABNORMAL LOW (ref 26.6–33.0)
MCHC: 31.8 g/dL (ref 31.5–35.7)
MCV: 73 fL — ABNORMAL LOW (ref 79–97)
Platelets: 281 10*3/uL (ref 150–450)
RBC: 4.3 x10E6/uL (ref 3.77–5.28)
RDW: 13.4 % (ref 11.7–15.4)
WBC: 8.6 10*3/uL (ref 3.4–10.8)

## 2019-05-27 ENCOUNTER — Other Ambulatory Visit: Payer: Self-pay

## 2019-05-27 ENCOUNTER — Ambulatory Visit (INDEPENDENT_AMBULATORY_CARE_PROVIDER_SITE_OTHER): Payer: Medicaid Other

## 2019-05-27 VITALS — BP 136/70 | Ht 67.0 in

## 2019-05-27 DIAGNOSIS — O24410 Gestational diabetes mellitus in pregnancy, diet controlled: Secondary | ICD-10-CM

## 2019-05-27 DIAGNOSIS — O0990 Supervision of high risk pregnancy, unspecified, unspecified trimester: Secondary | ICD-10-CM

## 2019-05-27 DIAGNOSIS — Z0130 Encounter for examination of blood pressure without abnormal findings: Secondary | ICD-10-CM

## 2019-05-27 MED ORDER — FERROUS SULFATE 325 (65 FE) MG PO TABS: 325.0000 mg | ORAL_TABLET | Freq: Every day | ORAL | 1 refills | Status: DC

## 2019-05-27 MED ORDER — CITRANATAL BLOOM 90-1 MG PO TABS: 1.0000 | ORAL_TABLET | Freq: Every day | ORAL | 3 refills | Status: DC

## 2019-05-27 NOTE — Progress Notes (Signed)
I connected with  Samantha Harris on 05/27/19 by Telephone enabled telemedicine application and verified that I am speaking with the correct person using two identifiers.   I discussed the limitations of evaluation and management by telemedicine. The patient expressed understanding and agreed to proceed.  Subjective:  Samantha Harris is a 38 y.o. female here for BP check.   Hypertension ROS: No TIA's, headaches, or blurry visions.    Objective:  BP 136/70   Ht 5\' 7"  (1.702 m)   LMP 08/04/2018   BMI 38.75 kg/m   Blood Pressure self obtained.   Assessment:   Blood Pressure stable.   Plan:  Current treatment plan is effective, no change in therapy. In person OB visit @ [redacted] weeks gestation 05/31/2019. Patient requested Rx refills.

## 2019-05-28 ENCOUNTER — Other Ambulatory Visit: Payer: Self-pay | Admitting: Advanced Practice Midwife

## 2019-05-28 DIAGNOSIS — O133 Gestational [pregnancy-induced] hypertension without significant proteinuria, third trimester: Secondary | ICD-10-CM

## 2019-05-28 NOTE — Progress Notes (Signed)
Us/

## 2019-05-28 NOTE — Addendum Note (Signed)
Addended by: Maretta Bees on: 05/28/2019 10:24 AM   Modules accepted: Orders

## 2019-05-31 ENCOUNTER — Ambulatory Visit (INDEPENDENT_AMBULATORY_CARE_PROVIDER_SITE_OTHER): Payer: Medicaid Other | Admitting: Medical

## 2019-05-31 ENCOUNTER — Ambulatory Visit (HOSPITAL_COMMUNITY)
Admission: RE | Admit: 2019-05-31 | Discharge: 2019-05-31 | Disposition: A | Discharge status: Home / Self Care | Payer: Medicaid Other | Source: Ambulatory Visit | Attending: Obstetrics and Gynecology | Admitting: Obstetrics and Gynecology

## 2019-05-31 ENCOUNTER — Other Ambulatory Visit: Payer: Self-pay

## 2019-05-31 ENCOUNTER — Encounter: Payer: Self-pay | Admitting: Medical

## 2019-05-31 VITALS — BP 123/79 | HR 83 | Wt 244.4 lb

## 2019-05-31 DIAGNOSIS — O24410 Gestational diabetes mellitus in pregnancy, diet controlled: Secondary | ICD-10-CM

## 2019-05-31 DIAGNOSIS — Z363 Encounter for antenatal screening for malformations: Secondary | ICD-10-CM

## 2019-05-31 DIAGNOSIS — F4001 Agoraphobia with panic disorder: Secondary | ICD-10-CM

## 2019-05-31 DIAGNOSIS — O99210 Obesity complicating pregnancy, unspecified trimester: Secondary | ICD-10-CM

## 2019-05-31 DIAGNOSIS — Z3A39 39 weeks gestation of pregnancy: Secondary | ICD-10-CM

## 2019-05-31 DIAGNOSIS — O0990 Supervision of high risk pregnancy, unspecified, unspecified trimester: Secondary | ICD-10-CM

## 2019-05-31 DIAGNOSIS — O09523 Supervision of elderly multigravida, third trimester: Secondary | ICD-10-CM

## 2019-05-31 DIAGNOSIS — O133 Gestational [pregnancy-induced] hypertension without significant proteinuria, third trimester: Secondary | ICD-10-CM | POA: Diagnosis not present

## 2019-05-31 DIAGNOSIS — O163 Unspecified maternal hypertension, third trimester: Secondary | ICD-10-CM | POA: Diagnosis not present

## 2019-05-31 DIAGNOSIS — O99213 Obesity complicating pregnancy, third trimester: Secondary | ICD-10-CM

## 2019-05-31 NOTE — Progress Notes (Signed)
   PRENATAL VISIT NOTE  Subjective:  Samantha Harris is a 38 y.o. [id number] at [redacted]w[redacted]d being seen today for ongoing prenatal care.  She is currently monitored for the following issues for this high-risk pregnancy and has Panic disorder with agoraphobia and mild panic attacks; Tobacco abuse; Family history of colon cancer requiring screening colonoscopy; Hidradenitis axillaris; Obesity (BMI 30-39.9); Advanced maternal age in multigravida; Supervision of high risk pregnancy, antepartum; Obesity in pregnancy, antepartum; and Gestational diabetes on their problem list.  Patient reports backache.  Contractions: Irregular. Vag. Bleeding: None.  Movement: Present. Denies leaking of fluid.   The following portions of the patient's history were reviewed and updated as appropriate: allergies, current medications, past family history, past medical history, past social history, past surgical history and problem list.   Objective:   Vitals:   05/31/19 1023  BP: 123/79  Pulse: 83  Weight: 244 lb 6.4 oz (110.9 kg)    Fetal Status: Fetal Heart Rate (bpm): 130   Movement: Present  Presentation: Vertex  General:  Alert, oriented and cooperative. Patient is in no acute distress.  Skin: Skin is warm and dry. No rash noted.   Cardiovascular: Normal heart rate noted  Respiratory: Normal respiratory effort, no problems with respiration noted  Abdomen: Soft, gravid, appropriate for gestational age.  Pain/Pressure: Present     Pelvic: Cervical exam performed Dilation: 1 Effacement (%): 0 Station: -3  Extremities: Normal range of motion.  Edema: None  Mental Status: Normal mood and affect. Normal behavior. Normal judgment and thought content.   Assessment and Plan:  Pregnancy: [id number] at [redacted]w[redacted]d 1. Supervision of high risk pregnancy, antepartum - IOL scheduled 1 week - Plan to insert Foley balloon next Thursday   2. Diet controlled gestational diabetes mellitus (GDM) in third trimester - Patient had not  checked the last few days due to lack of supplies - Fasting this morning 91 - Advised to continue monitoring BG QID  3. Multigravida of advanced maternal age in third trimester - < 40, no change in management   4. Obesity in pregnancy, antepartum - weight gain 9#  5. Panic disorder with agoraphobia and mild panic attacks  Term labor symptoms and general obstetric precautions including but not limited to vaginal bleeding, contractions, leaking of fluid and fetal movement were reviewed in detail with the patient. Please refer to After Visit Summary for other counseling recommendations.   Return in about 6 days (around 06/06/2019) for foley placement in person.  Future Appointments  Date Time Provider Department Center  05/31/2019 12:30 PM [facility] [place] 1 [facility]    Vonzella Nipple, PA-C

## 2019-05-31 NOTE — Patient Instructions (Signed)
Fetal Movement Counts Patient Name: ________________________________________________ Patient Due Date: ____________________ What is a fetal movement count?  A fetal movement count is the number of times that you feel your baby move during a certain amount of time. This may also be called a fetal kick count. A fetal movement count is recommended for every pregnant woman. You may be asked to start counting fetal movements as early as week 28 of your pregnancy. Pay attention to when your baby is most active. You may notice your baby's sleep and wake cycles. You may also notice things that make your baby move more. You should do a fetal movement count:  When your baby is normally most active.  At the same time each day. A good time to count movements is while you are resting, after having something to eat and drink. How do I count fetal movements? 1. Find a quiet, comfortable area. Sit, or lie down on your side. 2. Write down the date, the start time and stop time, and the number of movements that you felt between those two times. Take this information with you to your health care visits. 3. For 2 hours, count kicks, flutters, swishes, rolls, and jabs. You should feel at least 10 movements during 2 hours. 4. You may stop counting after you have felt 10 movements. 5. If you do not feel 10 movements in 2 hours, have something to eat and drink. Then, keep resting and counting for 1 hour. If you feel at least 4 movements during that hour, you may stop counting. Contact a health care provider if:  You feel fewer than 4 movements in 2 hours.  Your baby is not moving like he or she usually does. Date: ____________ Start time: ____________ Stop time: ____________ Movements: ____________ Date: ____________ Start time: ____________ Stop time: ____________ Movements: ____________ Date: ____________ Start time: ____________ Stop time: ____________ Movements: ____________ Date: ____________ Start time:  ____________ Stop time: ____________ Movements: ____________ Date: ____________ Start time: ____________ Stop time: ____________ Movements: ____________ Date: ____________ Start time: ____________ Stop time: ____________ Movements: ____________ Date: ____________ Start time: ____________ Stop time: ____________ Movements: ____________ Date: ____________ Start time: ____________ Stop time: ____________ Movements: ____________ Date: ____________ Start time: ____________ Stop time: ____________ Movements: ____________ This information is not intended to replace advice given to you by your health care provider. Make sure you discuss any questions you have with your health care provider. Document Released: 01/11/2007 Document Revised: 08/10/2016 Document Reviewed: 01/21/2016 Elsevier Interactive Patient Education  2019 [employer] Inc[employer] Braxton Hicks Contractions Contractions of the uterus can occur throughout pregnancy, but they are not always a sign that you are in labor. You may have practice contractions called Braxton Hicks contractions. These false labor contractions are sometimes confused with true labor. What are Braxton Hicks contractions? Braxton Hicks contractions are tightening movements that occur in the muscles of the uterus before labor. Unlike true labor contractions, these contractions do not result in opening (dilation) and thinning of the cervix. Toward the end of pregnancy (32-34 weeks), Braxton Hicks contractions can happen more often and may become stronger. These contractions are sometimes difficult to tell apart from true labor because they can be very uncomfortable. You should not feel embarrassed if you go to the hospital with false labor. Sometimes, the only way to tell if you are in true labor is for your health care provider to look for changes in the cervix. The health care provider will do a physical exam and may monitor your contractions. If   you are not in true labor, the exam  should show that your cervix is not dilating and your water has not broken. If there are no other health problems associated with your pregnancy, it is completely safe for you to be sent home with false labor. You may continue to have Braxton Hicks contractions until you go into true labor. How to tell the difference between true labor and false labor True labor  Contractions last 30-70 seconds.  Contractions become very regular.  Discomfort is usually felt in the top of the uterus, and it spreads to the lower abdomen and low back.  Contractions do not go away with walking.  Contractions usually become more intense and increase in frequency.  The cervix dilates and gets thinner. False labor  Contractions are usually shorter and not as strong as true labor contractions.  Contractions are usually irregular.  Contractions are often felt in the front of the lower abdomen and in the groin.  Contractions may go away when you walk around or change positions while lying down.  Contractions get weaker and are shorter-lasting as time goes on.  The cervix usually does not dilate or become thin. Follow these instructions at home:   Take over-the-counter and prescription medicines only as told by your health care provider.  Keep up with your usual exercises and follow other instructions from your health care provider.  Eat and drink lightly if you think you are going into labor.  If Braxton Hicks contractions are making you uncomfortable: ? Change your position from lying down or resting to walking, or change from walking to resting. ? Sit and rest in a tub of warm water. ? Drink enough fluid to keep your urine pale yellow. Dehydration may cause these contractions. ? Do slow and deep breathing several times an hour.  Keep all follow-up prenatal visits as told by your health care provider. This is important. Contact a health care provider if:  You have a fever.  You have continuous  pain in your abdomen. Get help right away if:  Your contractions become stronger, more regular, and closer together.  You have fluid leaking or gushing from your vagina.  You pass blood-tinged mucus (bloody show).  You have bleeding from your vagina.  You have low back pain that you never had before.  You feel your baby's head pushing down and causing pelvic pressure.  Your baby is not moving inside you as much as it used to. Summary  Contractions that occur before labor are called Braxton Hicks contractions, false labor, or practice contractions.  Braxton Hicks contractions are usually shorter, weaker, farther apart, and less regular than true labor contractions. True labor contractions usually become progressively stronger and regular, and they become more frequent.  Manage discomfort from Braxton Hicks contractions by changing position, resting in a warm bath, drinking plenty of water, or practicing deep breathing. This information is not intended to replace advice given to you by your health care provider. Make sure you discuss any questions you have with your health care provider. Document Released: 04/27/2017 Document Revised: 09/26/2017 Document Reviewed: 04/27/2017 Elsevier Interactive Patient Education  2019 Elsevier Inc.  

## 2019-05-31 NOTE — Progress Notes (Signed)
Pt is here for ROB. [redacted]w[redacted]d. Pt fasting BG 91 this morning, pt reports she hasn't eaten yet this AM.

## 2019-06-03 ENCOUNTER — Encounter (HOSPITAL_COMMUNITY): Payer: Self-pay

## 2019-06-03 ENCOUNTER — Inpatient Hospital Stay (HOSPITAL_COMMUNITY)
Admission: AD | Admit: 2019-06-03 | Discharge: 2019-06-03 | Disposition: A | Discharge status: Home / Self Care | Payer: Medicaid Other | Attending: Obstetrics & Gynecology | Admitting: Obstetrics & Gynecology

## 2019-06-03 ENCOUNTER — Other Ambulatory Visit: Payer: Self-pay

## 2019-06-03 DIAGNOSIS — M549 Dorsalgia, unspecified: Secondary | ICD-10-CM | POA: Diagnosis present

## 2019-06-03 DIAGNOSIS — O479 False labor, unspecified: Secondary | ICD-10-CM

## 2019-06-03 DIAGNOSIS — Z3A39 39 weeks gestation of pregnancy: Secondary | ICD-10-CM | POA: Diagnosis not present

## 2019-06-03 DIAGNOSIS — O09523 Supervision of elderly multigravida, third trimester: Secondary | ICD-10-CM | POA: Insufficient documentation

## 2019-06-03 DIAGNOSIS — O471 False labor at or after 37 completed weeks of gestation: Secondary | ICD-10-CM | POA: Insufficient documentation

## 2019-06-03 DIAGNOSIS — O0990 Supervision of high risk pregnancy, unspecified, unspecified trimester: Secondary | ICD-10-CM

## 2019-06-03 NOTE — Discharge Instructions (Signed)
Braxton Hicks Contractions Contractions of the uterus can occur throughout pregnancy, but they are not always a sign that you are in labor. You may have practice contractions called Braxton Hicks contractions. These false labor contractions are sometimes confused with true labor. What are Braxton Hicks contractions? Braxton Hicks contractions are tightening movements that occur in the muscles of the uterus before labor. Unlike true labor contractions, these contractions do not result in opening (dilation) and thinning of the cervix. Toward the end of pregnancy (32-34 weeks), Braxton Hicks contractions can happen more often and may become stronger. These contractions are sometimes difficult to tell apart from true labor because they can be very uncomfortable. You should not feel embarrassed if you go to the hospital with false labor. Sometimes, the only way to tell if you are in true labor is for your health care provider to look for changes in the cervix. The health care provider will do a physical exam and may monitor your contractions. If you are not in true labor, the exam should show that your cervix is not dilating and your water has not broken. If there are no other health problems associated with your pregnancy, it is completely safe for you to be sent home with false labor. You may continue to have Braxton Hicks contractions until you go into true labor. How to tell the difference between true labor and false labor True labor  Contractions last 30-70 seconds.  Contractions become very regular.  Discomfort is usually felt in the top of the uterus, and it spreads to the lower abdomen and low back.  Contractions do not go away with walking.  Contractions usually become more intense and increase in frequency.  The cervix dilates and gets thinner. False labor  Contractions are usually shorter and not as strong as true labor contractions.  Contractions are usually irregular.  Contractions  are often felt in the front of the lower abdomen and in the groin.  Contractions may go away when you walk around or change positions while lying down.  Contractions get weaker and are shorter-lasting as time goes on.  The cervix usually does not dilate or become thin. Follow these instructions at home:   Take over-the-counter and prescription medicines only as told by your health care provider.  Keep up with your usual exercises and follow other instructions from your health care provider.  Eat and drink lightly if you think you are going into labor.  If Braxton Hicks contractions are making you uncomfortable: ? Change your position from lying down or resting to walking, or change from walking to resting. ? Sit and rest in a tub of warm water. ? Drink enough fluid to keep your urine pale yellow. Dehydration may cause these contractions. ? Do slow and deep breathing several times an hour.  Keep all follow-up prenatal visits as told by your health care provider. This is important. Contact a health care provider if:  You have a fever.  You have continuous pain in your abdomen. Get help right away if:  Your contractions become stronger, more regular, and closer together.  You have fluid leaking or gushing from your vagina.  You pass blood-tinged mucus (bloody show).  You have bleeding from your vagina.  You have low back pain that you never had before.  You feel your baby's head pushing down and causing pelvic pressure.  Your baby is not moving inside you as much as it used to. Summary  Contractions that occur before labor are   called Braxton Hicks contractions, false labor, or practice contractions.  Braxton Hicks contractions are usually shorter, weaker, farther apart, and less regular than true labor contractions. True labor contractions usually become progressively stronger and regular, and they become more frequent.  Manage discomfort from Braxton Hicks contractions  by changing position, resting in a warm bath, drinking plenty of water, or practicing deep breathing. This information is not intended to replace advice given to you by your health care provider. Make sure you discuss any questions you have with your health care provider. Document Released: 04/27/2017 Document Revised: 09/26/2017 Document Reviewed: 04/27/2017 Elsevier Interactive Patient Education  2019 Elsevier Inc.  

## 2019-06-03 NOTE — MAU Note (Signed)
Pt c/o contractions since this morning. Thinks about 5 mins apart but unsure. Feeling in her back, too. Denies LOF or bleeding. +FM. Want SVE 1 cm last Friday.

## 2019-06-03 NOTE — MAU Provider Note (Signed)
RN Labor Eval   Ms. Puerta is a 38yo [id number] at [redacted]w[redacted]d who presented to MAU with contractions and some back pain. States contractions aren't too painful yet but wanted to see if she was dilating because it's her fourth baby. SVE 1/20/-3 and cervix posterior. Denies vaginal bleeding, LOF and reports normal fetal movement. Reactive and reassuring tracing -130 to 150s/mod/+a/-d with uterine irritability. Not in active labor and stable for discharge home. Labor precautions reviewed by RN prior to discharge. Has induction scheduled at 40w0 for GDM.   Lambert Mody. Juleen China, DO OB/GYN Fellow

## 2019-06-04 ENCOUNTER — Other Ambulatory Visit (HOSPITAL_COMMUNITY): Payer: Self-pay

## 2019-06-04 ENCOUNTER — Encounter (HOSPITAL_COMMUNITY): Payer: Self-pay

## 2019-06-04 ENCOUNTER — Telehealth (HOSPITAL_COMMUNITY): Payer: Self-pay

## 2019-06-04 ENCOUNTER — Other Ambulatory Visit: Payer: Self-pay | Admitting: Advanced Practice Midwife

## 2019-06-04 NOTE — Telephone Encounter (Signed)
Preadmission screen  

## 2019-06-05 ENCOUNTER — Other Ambulatory Visit: Payer: Self-pay

## 2019-06-05 ENCOUNTER — Other Ambulatory Visit (HOSPITAL_COMMUNITY)
Admission: RE | Admit: 2019-06-05 | Discharge: 2019-06-05 | Disposition: A | Discharge status: Home / Self Care | Payer: Medicaid Other | Source: Ambulatory Visit | Attending: Obstetrics & Gynecology | Admitting: Obstetrics & Gynecology

## 2019-06-05 DIAGNOSIS — Z1159 Encounter for screening for other viral diseases: Secondary | ICD-10-CM | POA: Diagnosis not present

## 2019-06-05 DIAGNOSIS — Z01812 Encounter for preprocedural laboratory examination: Secondary | ICD-10-CM | POA: Insufficient documentation

## 2019-06-05 NOTE — MAU Note (Signed)
Covid swab collected. Pt tolerated well. PT asymptomatic 

## 2019-06-06 ENCOUNTER — Inpatient Hospital Stay (EMERGENCY_DEPARTMENT_HOSPITAL)
Admission: AD | Admit: 2019-06-06 | Discharge: 2019-06-06 | Disposition: A | Discharge status: Home / Self Care | Payer: Medicaid Other | Source: Home / Self Care | Attending: Obstetrics and Gynecology | Admitting: Obstetrics and Gynecology

## 2019-06-06 ENCOUNTER — Other Ambulatory Visit: Payer: Self-pay

## 2019-06-06 ENCOUNTER — Other Ambulatory Visit (HOSPITAL_COMMUNITY): Payer: Self-pay | Admitting: *Deleted

## 2019-06-06 ENCOUNTER — Other Ambulatory Visit: Payer: Medicaid Other

## 2019-06-06 ENCOUNTER — Encounter (HOSPITAL_COMMUNITY): Payer: Self-pay

## 2019-06-06 ENCOUNTER — Ambulatory Visit (INDEPENDENT_AMBULATORY_CARE_PROVIDER_SITE_OTHER): Payer: Medicaid Other | Admitting: Obstetrics and Gynecology

## 2019-06-06 VITALS — BP 127/80 | HR 97 | Wt 239.0 lb

## 2019-06-06 DIAGNOSIS — O99213 Obesity complicating pregnancy, third trimester: Secondary | ICD-10-CM | POA: Diagnosis not present

## 2019-06-06 DIAGNOSIS — O09523 Supervision of elderly multigravida, third trimester: Secondary | ICD-10-CM

## 2019-06-06 DIAGNOSIS — O99210 Obesity complicating pregnancy, unspecified trimester: Secondary | ICD-10-CM

## 2019-06-06 DIAGNOSIS — O0990 Supervision of high risk pregnancy, unspecified, unspecified trimester: Secondary | ICD-10-CM

## 2019-06-06 DIAGNOSIS — Z7951 Long term (current) use of inhaled steroids: Secondary | ICD-10-CM | POA: Insufficient documentation

## 2019-06-06 DIAGNOSIS — Z3A39 39 weeks gestation of pregnancy: Secondary | ICD-10-CM | POA: Diagnosis not present

## 2019-06-06 DIAGNOSIS — Z87891 Personal history of nicotine dependence: Secondary | ICD-10-CM | POA: Insufficient documentation

## 2019-06-06 DIAGNOSIS — Z825 Family history of asthma and other chronic lower respiratory diseases: Secondary | ICD-10-CM | POA: Insufficient documentation

## 2019-06-06 DIAGNOSIS — Z823 Family history of stroke: Secondary | ICD-10-CM | POA: Insufficient documentation

## 2019-06-06 DIAGNOSIS — Z3A36 36 weeks gestation of pregnancy: Secondary | ICD-10-CM | POA: Insufficient documentation

## 2019-06-06 DIAGNOSIS — O24410 Gestational diabetes mellitus in pregnancy, diet controlled: Secondary | ICD-10-CM | POA: Diagnosis not present

## 2019-06-06 DIAGNOSIS — Z8632 Personal history of gestational diabetes: Secondary | ICD-10-CM | POA: Insufficient documentation

## 2019-06-06 DIAGNOSIS — Z833 Family history of diabetes mellitus: Secondary | ICD-10-CM | POA: Insufficient documentation

## 2019-06-06 DIAGNOSIS — O4693 Antepartum hemorrhage, unspecified, third trimester: Secondary | ICD-10-CM

## 2019-06-06 DIAGNOSIS — Z8249 Family history of ischemic heart disease and other diseases of the circulatory system: Secondary | ICD-10-CM | POA: Insufficient documentation

## 2019-06-06 DIAGNOSIS — Z7982 Long term (current) use of aspirin: Secondary | ICD-10-CM | POA: Insufficient documentation

## 2019-06-06 LAB — NOVEL CORONAVIRUS, NAA (HOSP ORDER, SEND-OUT TO REF LAB; TAT 18-24 HRS): SARS-CoV-2, NAA: NOT DETECTED

## 2019-06-06 NOTE — Progress Notes (Signed)
ROB   NST   CC: NONE    

## 2019-06-06 NOTE — MAU Note (Signed)
Pt states she got the foley bulb placed at 1500.  Pt states she started noticing blood around 1600.   Pt reports vaginal bleeding and blood in the tube.

## 2019-06-06 NOTE — MAU Provider Note (Signed)
Chief Complaint:  Vaginal Bleeding   First Provider Initiated Contact with Patient 06/06/19 2051      HPI: Samantha Harris is a 38 y.o. [id number] at 35w6dho presents to maternity admissions reporting blood in her Foley Tubing.  Had Foley placed at 1500 and noticed the blood at 1600hrs.  Has had some discharge on underwear but did not soak past to clothing. . She reports good fetal movement, denies LOF, vaginal itching/burning, urinary symptoms, h/a, dizziness, n/v, diarrhea, constipation or fever/chills.  She denies headache, visual changes or RUQ abdominal pain.  RN Note: Pt states she got the foley bulb placed at 1500. Pt states she started noticing blood around 1600.  Pt reports vaginal bleeding and blood in the tube.   Past Medical History: Past Medical History:  Diagnosis Date  . Anxiety   . Gestational diabetes   . HPV in female   . Migraine   . Vaginal Pap smear, abnormal     Past obstetric history: OB History  Gravida Para Term Preterm AB Living  6 3 3   2 3   SAB TAB Ectopic Multiple Live Births    2     3    # Outcome Date GA Lbr Len/2nd Weight Sex Delivery Anes PTL Lv  6 Current           5 Term 12/27/12 365w2d2:33 / 00:21 3665 g M Vag-Spont EPI  LIV     Birth Comments: Passed hearing screen Deferred Hep B  4 TAB 09/11/09 1257w0d      3 TAB 08/11/08 8w081w0d     2 Term 05/07/05 41w03w0d0 3685 g M Vag-Spont EPI N LIV  1 Term 03/25/03 72w2d56w2d 3289 g M Vag-Spont EPI  LIV    Past Surgical History: Past Surgical History:  Procedure Laterality Date  . MOUTH SURGERY      Family History: Family History  Problem Relation Age of Onset  . Colon cancer Mother 35  . 34art disease Father   . Hypertension Father   . Stroke Father   . Diabetes Sister   . Asthma Brother   . Diabetes Paternal Grandmother   . Stroke Paternal Grandmother   . Clotting disorder Paternal Grandmother     Social History: Social History   Tobacco Use  . Smoking status:  Former Smoker    Types: Cigarettes  . Smokeless tobacco: Never Used  Substance Use Topics  . Alcohol use: No    Comment: before pregnancy  . Drug use: No    Comment: past marijuana    Allergies: No Known Allergies  Meds:  Medications Prior to Admission  Medication Sig Dispense Refill Last Dose  . Accu-Chek FastClix Lancets MISC 1 Device by Percutaneous route 4 (four) times daily. 100 each 12 06/06/2019 at Unknown time  . acetaminophen (TYLENOL) 500 MG tablet Take 500 mg by mouth as needed.   Past Week at Unknown time  . BABY ASPIRIN PO Baby Aspirin   06/06/2019 at Unknown time  . Blood Glucose Monitoring Suppl (ACCU-CHEK GUIDE) w/Device KIT 1 Device by Does not apply route 4 (four) times daily. 1 kit 0 06/06/2019 at Unknown time  . Elastic Bandages & Supports (COMFORT FIT MATERNITY SUPP LG) MISC 1 Units by Does not apply route daily as needed. 1 each 0 06/05/2019 at Unknown time  . Elastic Bandages & Supports (COMFORT FIT MATERNITY SUPP SM) MISC 1 application by Does not apply route daily. 1  each 0 06/05/2019 at Unknown time  . ferrous sulfate 325 (65 FE) MG tablet Take 1 tablet (325 mg total) by mouth daily with breakfast. 30 tablet 1 06/06/2019 at Unknown time  . fluticasone (FLONASE) 50 MCG/ACT nasal spray Place 1 spray into both nostrils daily. 16 g 2 Past Week at Unknown time  . glucose blood (ACCU-CHEK GUIDE) test strip Use to check blood sugars four times a day was instructed 50 each 12 06/06/2019 at Unknown time  . Prenatal-DSS-FeCb-FeGl-FA (CITRANATAL BLOOM) 90-1 MG TABS Use as directed 1 tablet in the mouth or throat daily. 30 tablet 3 06/06/2019 at Unknown time  . famotidine (PEPCID) 20 MG tablet Take 1 tablet (20 mg total) by mouth 2 (two) times daily. (Patient not taking: Reported on 06/06/2019) 30 tablet 0   . ondansetron (ZOFRAN ODT) 4 MG disintegrating tablet Take 1 tablet (4 mg total) by mouth every 8 (eight) hours as needed for nausea or vomiting. (Patient not taking: Reported on  06/06/2019) 20 tablet 0   . polyethylene glycol (MIRALAX) packet Take 17 g by mouth daily. (Patient not taking: Reported on 06/06/2019) 14 each 0   . Prenatal Vit-Fe Fumarate-FA (PRENATAL VITAMINS PO) Take by mouth.       I have reviewed patient's Past Medical Hx, Surgical Hx, Family Hx, Social Hx, medications and allergies.   ROS:  Review of Systems  Constitutional: Negative for chills and fever.  Respiratory: Negative for shortness of breath.   Gastrointestinal: Negative for abdominal pain, constipation, diarrhea and nausea.  Genitourinary: Positive for vaginal bleeding.   Other systems negative  Physical Exam   Patient Vitals for the past 24 hrs:  BP Temp Temp src Pulse Resp SpO2 Weight  06/06/19 2106 (!) 142/66 - - 80 - - -  06/06/19 2034 135/76 98.3 F (36.8 C) Oral 88 20 98 % -  06/06/19 2027 - - - - - - 109.9 kg   Vitals:   06/06/19 2027 06/06/19 2034 06/06/19 2106 06/06/19 2124  BP:  135/76 (!) 142/66 128/62  Pulse:  88 80 74  Resp:  20    Temp:  98.3 F (36.8 C)    TempSrc:  Oral    SpO2:  98%    Weight: 109.9 kg       Constitutional: Well-developed, well-nourished female in no acute distress.  Cardiovascular: normal rate and rhythm Respiratory: normal effort, clear to auscultation bilaterally GI: Abd soft, non-tender, gravid appropriate for gestational age.   No rebound or guarding. MS: Extremities nontender, no edema, normal ROM Neurologic: Alert and oriented x 4.  GU: Neg CVAT.  PELVIC EXAM:  Dilation: 2 Effacement (%): 70 Exam by:: Hansel Feinstein, CNM Small amount of dark red blood in tubing of Foley No blood on glove with exam  FHT:  Baseline 140 , moderate variability, accelerations present, no decelerations Contractions: Irregular     Labs: Results for orders placed or performed during the hospital encounter of 06/05/19 (from the past 48 hour(s))  Novel Coronavirus, NAA (hospital order; send-out to ref lab)     Status: None   Collection Time:  06/05/19  6:58 AM   Specimen: Nasopharyngeal Swab; Respiratory  Result Value Ref Range   SARS-CoV-2, NAA NOT DETECTED NOT DETECTED    Comment: (NOTE) This test was developed and its performance characteristics determined by [employer]. This test has not been FDA cleared or approved. This test has been authorized by FDA under an Emergency Use Authorization (EUA). This test is only authorized  for the duration of time the declaration that circumstances exist justifying the authorization of the emergency use of in vitro diagnostic tests for detection of SARS-CoV-2 virus and/or diagnosis of COVID-19 infection under section 564(b)(1) of the Act, 21 U.S.C. 471GXI-7(H)(2), unless the authorization is terminated or revoked sooner. When diagnostic testing is negative, the possibility of a false negative result should be considered in the context of a patient's recent exposures and the presence of clinical signs and symptoms consistent with COVID-19. An individual without symptoms of COVID-19 and who is not shedding SARS-CoV-2 virus would expect to have a negative (not detected) result in this assay. Performed  At: [facility] [place], [place] [id number] Rush Farmer MD OF:[id number]    Coronavirus Source NASOPHARYNGEAL     Comment: Performed at [facility], [place] [address], [place], [place] [address]    Imaging:    MAU Course/MDM: NST reviewed and is reassuring with irregular contractions.  She had a single elevated systolic but no symptoms of preeclampsia and labs were normal last week.  Will have her come in as scheduled in a few hours for scheduled induction of labor Reassured her that this amount of blood can be normal with Foley.  No further bleeding is noted, so will send her home to return soon for IOL.    Assessment: SIUP at 44w0dBleeding with Foley bulb insertion Reassuring fetal heart rate tracing  Plan: Discharge home Labor  precautions and fetal kick counts Follow up in am for IOL Encouraged to return here or to other Urgent Care/ED if she develops worsening of symptoms, increase in pain, fever, or other concerning symptoms.  Pt stable at time of discharge.  MHansel FeinsteinCNM, MSN Certified Nurse-Midwife 06/06/2019 9:23 PM

## 2019-06-06 NOTE — Discharge Instructions (Signed)
Balloon Catheter Placement for Cervical Ripening  Balloon catheter placement for cervical ripening is a procedure to help your cervix start to soften (ripen) and open (dilate). It is done to prepare your body for labor induction. During this procedure, a thin tube (catheter) is placed through your cervix. A tiny balloon attached to the catheter is inflated with water. Pressure from the balloon is what helps your cervix start to open. Cervical ripening with a balloon catheter can make labor induction shorter and easier.  You may have this procedure if:  · Your cervix is not ready for labor.  · Your health care provider has planned labor induction.  · You are not having twins or multiples.  · Your baby is in the head-down position.  · You do not have any other pregnancy complications that require you to be monitored in the hospital after balloon catheter placement.  If your health care provider has recommended labor induction to stimulate a vaginal birth, this procedure may be started the day before induction. You will go home with the balloon in place and return to start induction in 12-24 hours. You may have this procedure and stay in the hospital so that your progress can be monitored as well.  Tell a health care provider about:  · Any allergies you have.  · All medicines you are taking, including vitamins, herbs, eye drops, creams, and over-the-counter medicines.  · Any blood disorders you have.  · Any surgeries you have had.  · Any medical conditions you have.  What are the risks?  Generally, this is a safe procedure. However, problems may occur, including:  · Infection.  · Bleeding.  · Cramping or pain.  · Difficulty passing urine.  · The baby moving from the head-down position to a position with the feet or buttocks down (breech position).  What happens before the procedure?  · Your health care provider may check your baby's heartbeat (fetal monitoring) before the procedure.  · You may be asked to empty your  bladder.  What happens during the procedure?    · You will be positioned on the exam table as if you were having a pelvic exam or Pap test.  · Your health care provider may insert a medical instrument into your vagina (speculum) to see your cervix.  · Your cervix may be cleaned with a germ-killing solution.  · The catheter will be inserted through the opening of your cervix.  · A balloon on the end of the catheter will be inflated with sterile water. Some catheters have two balloons, one on each side of the cervix.  · Depending on the type of balloon catheter, the end of the catheter may be left free outside your cervix or taped to your leg.  The procedure may vary among health care providers and hospitals.  What can I expect after the procedure?  · After the procedure, it is common to have:  ? A feeling of pressure inside your vagina.  ? Light vaginal bleeding (spotting).  · You may have fetal monitoring before going home.  · You may be sent home with the catheter in place and asked to return to start your induction in about 12-24 hours.  Follow these instructions at home:  · Take over-the-counter and prescription medicines only as told by your health care provider.  · Return to your normal activities at home as told by your health care provider. Ask your health care provider what activities are safe for you.   Do not leave home until you return for labor induction.  · You may shower at home. Do not take baths, swim, or use a hot tub unless your health care provider approves.  · As your cervix opens, your catheter and balloon may fall out before you return for labor induction. Ask your health care provider what you should do if this happens.  · Keep all follow-up visits as told by your health care provider. This is important. You will need to return to start induction as told by your health care provider.  Contact your health care provider if:  · You have chills or a fever.  · You have constant pain or cramps (not  contractions).  · You have trouble passing urine.  · Your water breaks.  · You have vaginal bleeding that is heavier than spotting.  · You have contractions that start to last longer and come closer together (about every 5 minutes).  · The balloon catheter falls out before you return to start your induction.  Summary  · Cervical ripening with placement of a balloon catheter is an outpatient procedure to prepare you for labor induction.  · Cervical ripening with a balloon catheter helps your cervix start to open for birth.  · You will be positioned on the exam table. The catheter will be inserted through the opening of your cervix. A balloon on the end of the catheter will be inflated with water.  · Pressure from the balloon will cause ripening of your cervix. You will go home with the balloon in place and return to start induction in 12-24 hours.  · Contact your health care provider if you have pain, fever, vaginal bleeding, or trouble passing urine. Also contact him or her if your water breaks, you start to go into labor, or your balloon catheter falls out before you return to start your induction.  This information is not intended to replace advice given to you by your health care provider. Make sure you discuss any questions you have with your health care provider.  Document Released: 08/10/2018 Document Revised: 08/10/2018 Document Reviewed: 08/10/2018  Elsevier Interactive Patient Education © 2019 Elsevier Inc.

## 2019-06-06 NOTE — Progress Notes (Signed)
   PRENATAL VISIT NOTE  Subjective:  Samantha Harris is a 38 y.o. [id number] at [redacted]w[redacted]d being seen today for ongoing prenatal care.  She is currently monitored for the following issues for this high-risk pregnancy and has Panic disorder with agoraphobia and mild panic attacks; Tobacco abuse; Family history of colon cancer requiring screening colonoscopy; Hidradenitis axillaris; Obesity (BMI 30-39.9); Advanced maternal age in multigravida; Supervision of high risk pregnancy, antepartum; Obesity in pregnancy, antepartum; Gestational diabetes; and GDM, class A1 on their problem list.  Patient reports no complaints.  Contractions: Irritability. Vag. Bleeding: None.  Movement: Present. Denies leaking of fluid.   The following portions of the patient's history were reviewed and updated as appropriate: allergies, current medications, past family history, past medical history, past social history, past surgical history and problem list. Problem list updated.  Objective:   Vitals:   06/06/19 1426  BP: 127/80  Pulse: 97  Weight: 239 lb (108.4 kg)    Fetal Status: Fetal Heart Rate (bpm): NST   Movement: Present     General:  Alert, oriented and cooperative. Patient is in no acute distress.  Skin: Skin is warm and dry. No rash noted.   Cardiovascular: Normal heart rate noted  Respiratory: Normal respiratory effort, no problems with respiration noted  Abdomen: Soft, gravid, appropriate for gestational age.  Pain/Pressure: Present     Pelvic: 1/thick/-3        Extremities: Normal range of motion.  Edema: None  Mental Status:  Normal mood and affect. Normal behavior. Normal judgment and thought content.  Procedure: Patient informed of R/B/A of procedure. NST was performed and was reactive prior to procedure. NST:  EFM: Baseline: 135 bpm Toco: none Procedure done to begin ripening of the cervix prior to admission for induction of labor. Appropriate time out taken. The patient was placed in the  lithotomy position and a cervical exam was performed and a finger was used to guide the 64F foley balloon through the internal os of the cervix. Foley Balloon filled with 40cc of sterile water. Plug inserted into end of the foley. Foley placed on tension and taped to medial thigh.  NST:  EFM Baseline: 140 bpm  Toco: none There were no signs of tachysystole or hypertonus. All equipment was removed and accounted for. The patient tolerated the procedure well.  Assessment and Plan:  Pregnancy: [id number] at [redacted]w[redacted]d 1. Supervision of high risk pregnancy, antepartum For outpatietn foley bulb today, reviewed risks/benefits, patient agreeable - Fetal nonstress test; Future - Fetal nonstress test; Future  2. Obesity in pregnancy, antepartum  3. Multigravida of advanced maternal age in third trimester  4. Diet controlled gestational diabetes mellitus (GDM) in third trimester All CBG within range Cont diet control  S/p Outpatient placement of foley balloon catheter for cervical ripening. Induction of labor scheduled for tomorrow at 0730 am. Reassuring FHR tracing with no concerns at present. Warning signs given to patient to include return to MAU for heavy vaginal bleeding, Rupture of membranes, painful uterine contractions q 5 mins or less, severe abdominal discomfort, decreased fetal movement.  No follow-ups on file.   Sloan Leiter, MD 06/07/2019 8:43 AM

## 2019-06-07 ENCOUNTER — Inpatient Hospital Stay (HOSPITAL_COMMUNITY): Payer: Medicaid Other

## 2019-06-07 ENCOUNTER — Inpatient Hospital Stay (HOSPITAL_COMMUNITY): Payer: Medicaid Other | Admitting: Anesthesiology

## 2019-06-07 ENCOUNTER — Inpatient Hospital Stay (HOSPITAL_COMMUNITY)
Admission: AD | Admit: 2019-06-07 | Discharge: 2019-06-09 | DRG: 807 | Disposition: A | Discharge status: Home / Self Care | Payer: Medicaid Other | Source: Ambulatory Visit | Attending: Obstetrics & Gynecology | Admitting: Obstetrics & Gynecology

## 2019-06-07 ENCOUNTER — Encounter (HOSPITAL_COMMUNITY): Payer: Self-pay | Admitting: *Deleted

## 2019-06-07 ENCOUNTER — Other Ambulatory Visit: Payer: Self-pay

## 2019-06-07 DIAGNOSIS — F4001 Agoraphobia with panic disorder: Secondary | ICD-10-CM | POA: Diagnosis present

## 2019-06-07 DIAGNOSIS — O99344 Other mental disorders complicating childbirth: Secondary | ICD-10-CM | POA: Diagnosis present

## 2019-06-07 DIAGNOSIS — Z87891 Personal history of nicotine dependence: Secondary | ICD-10-CM | POA: Diagnosis not present

## 2019-06-07 DIAGNOSIS — E669 Obesity, unspecified: Secondary | ICD-10-CM | POA: Diagnosis present

## 2019-06-07 DIAGNOSIS — O09529 Supervision of elderly multigravida, unspecified trimester: Secondary | ICD-10-CM

## 2019-06-07 DIAGNOSIS — O24410 Gestational diabetes mellitus in pregnancy, diet controlled: Secondary | ICD-10-CM | POA: Diagnosis present

## 2019-06-07 DIAGNOSIS — O24420 Gestational diabetes mellitus in childbirth, diet controlled: Secondary | ICD-10-CM | POA: Diagnosis not present

## 2019-06-07 DIAGNOSIS — O0990 Supervision of high risk pregnancy, unspecified, unspecified trimester: Secondary | ICD-10-CM

## 2019-06-07 DIAGNOSIS — Z3A40 40 weeks gestation of pregnancy: Secondary | ICD-10-CM | POA: Diagnosis not present

## 2019-06-07 DIAGNOSIS — O99214 Obesity complicating childbirth: Secondary | ICD-10-CM | POA: Diagnosis present

## 2019-06-07 DIAGNOSIS — Z3A39 39 weeks gestation of pregnancy: Secondary | ICD-10-CM | POA: Diagnosis not present

## 2019-06-07 DIAGNOSIS — O99210 Obesity complicating pregnancy, unspecified trimester: Secondary | ICD-10-CM | POA: Diagnosis present

## 2019-06-07 DIAGNOSIS — O4693 Antepartum hemorrhage, unspecified, third trimester: Secondary | ICD-10-CM | POA: Diagnosis not present

## 2019-06-07 LAB — ABO/RH: ABO/RH(D): O POS

## 2019-06-07 LAB — CBC
HCT: 32.4 % — ABNORMAL LOW (ref 36.0–46.0)
Hemoglobin: 10.1 g/dL — ABNORMAL LOW (ref 12.0–15.0)
MCH: 23.4 pg — ABNORMAL LOW (ref 26.0–34.0)
MCHC: 31.2 g/dL (ref 30.0–36.0)
MCV: 75 fL — ABNORMAL LOW (ref 80.0–100.0)
Platelets: 288 10*3/uL (ref 150–400)
RBC: 4.32 MIL/uL (ref 3.87–5.11)
RDW: 14.9 % (ref 11.5–15.5)
WBC: 10.5 10*3/uL (ref 4.0–10.5)
nRBC: 0 % (ref 0.0–0.2)

## 2019-06-07 LAB — RPR: RPR Ser Ql: NONREACTIVE

## 2019-06-07 LAB — TYPE AND SCREEN
ABO/RH(D): O POS
Antibody Screen: NEGATIVE

## 2019-06-07 LAB — GLUCOSE, CAPILLARY: Glucose-Capillary: 70 mg/dL (ref 70–99)

## 2019-06-07 MED ORDER — EPHEDRINE 5 MG/ML INJ: 10.0000 mg | INTRAVENOUS | Status: DC | PRN

## 2019-06-07 MED ORDER — LACTATED RINGERS IV SOLN: 500.0000 mL | INTRAVENOUS | Status: DC | PRN

## 2019-06-07 MED ORDER — ONDANSETRON HCL 4 MG/2ML IJ SOLN: 4.0000 mg | Freq: Four times a day (QID) | INTRAMUSCULAR | Status: DC | PRN

## 2019-06-07 MED ORDER — DIBUCAINE (PERIANAL) 1 % EX OINT: 1.0000 "application " | TOPICAL_OINTMENT | CUTANEOUS | Status: DC | PRN

## 2019-06-07 MED ORDER — BENZOCAINE-MENTHOL 20-0.5 % EX AERO
1.0000 "application " | INHALATION_SPRAY | CUTANEOUS | Status: DC | PRN
  Administered 2019-06-08: 1 via TOPICAL
  Filled 2019-06-07: qty 56

## 2019-06-07 MED ORDER — WITCH HAZEL-GLYCERIN EX PADS: 1.0000 "application " | MEDICATED_PAD | CUTANEOUS | Status: DC | PRN

## 2019-06-07 MED ORDER — SIMETHICONE 80 MG PO CHEW: 80.0000 mg | CHEWABLE_TABLET | ORAL | Status: DC | PRN

## 2019-06-07 MED ORDER — FLEET ENEMA 7-19 GM/118ML RE ENEM: 1.0000 | ENEMA | RECTAL | Status: DC | PRN

## 2019-06-07 MED ORDER — ACETAMINOPHEN 325 MG PO TABS: 650.0000 mg | ORAL_TABLET | ORAL | Status: DC | PRN

## 2019-06-07 MED ORDER — LIDOCAINE HCL (PF) 1 % IJ SOLN: 30.0000 mL | INTRAMUSCULAR | Status: DC | PRN

## 2019-06-07 MED ORDER — LACTATED RINGERS IV SOLN: 500.0000 mL | Freq: Once | INTRAVENOUS | Status: DC

## 2019-06-07 MED ORDER — MISOPROSTOL 200 MCG PO TABS
ORAL_TABLET | ORAL | Status: AC
  Administered 2019-06-07: 20:00:00 800 ug via BUCCAL
  Filled 2019-06-07: qty 4

## 2019-06-07 MED ORDER — OXYCODONE HCL 5 MG PO TABS: 5.0000 mg | ORAL_TABLET | ORAL | Status: DC | PRN

## 2019-06-07 MED ORDER — LACTATED RINGERS IV SOLN
INTRAVENOUS | Status: DC
  Administered 2019-06-07 (×2): via INTRAVENOUS

## 2019-06-07 MED ORDER — LIDOCAINE HCL (PF) 1 % IJ SOLN
INTRAMUSCULAR | Status: DC | PRN
  Administered 2019-06-07 (×2): 4 mL via EPIDURAL

## 2019-06-07 MED ORDER — ZOLPIDEM TARTRATE 5 MG PO TABS: 5.0000 mg | ORAL_TABLET | Freq: Every evening | ORAL | Status: DC | PRN

## 2019-06-07 MED ORDER — ONDANSETRON HCL 4 MG PO TABS: 4.0000 mg | ORAL_TABLET | ORAL | Status: DC | PRN

## 2019-06-07 MED ORDER — OXYCODONE-ACETAMINOPHEN 5-325 MG PO TABS: 2.0000 | ORAL_TABLET | ORAL | Status: DC | PRN

## 2019-06-07 MED ORDER — FENTANYL-BUPIVACAINE-NACL 0.5-0.125-0.9 MG/250ML-% EP SOLN
12.0000 mL/h | EPIDURAL | Status: DC | PRN
  Filled 2019-06-07: qty 250

## 2019-06-07 MED ORDER — ONDANSETRON HCL 4 MG/2ML IJ SOLN: 4.0000 mg | INTRAMUSCULAR | Status: DC | PRN

## 2019-06-07 MED ORDER — PRENATAL MULTIVITAMIN CH
1.0000 | ORAL_TABLET | Freq: Every day | ORAL | Status: DC
  Administered 2019-06-08 – 2019-06-09 (×2): 1 via ORAL
  Filled 2019-06-07 (×2): qty 1

## 2019-06-07 MED ORDER — OXYTOCIN 40 UNITS IN NORMAL SALINE INFUSION - SIMPLE MED
1.0000 m[IU]/min | INTRAVENOUS | Status: DC
  Administered 2019-06-07: 2 m[IU]/min via INTRAVENOUS

## 2019-06-07 MED ORDER — DIPHENHYDRAMINE HCL 25 MG PO CAPS: 25.0000 mg | ORAL_CAPSULE | Freq: Four times a day (QID) | ORAL | Status: DC | PRN

## 2019-06-07 MED ORDER — IBUPROFEN 600 MG PO TABS
600.0000 mg | ORAL_TABLET | Freq: Four times a day (QID) | ORAL | Status: DC
  Administered 2019-06-07 – 2019-06-09 (×7): 600 mg via ORAL
  Filled 2019-06-07 (×7): qty 1

## 2019-06-07 MED ORDER — COCONUT OIL OIL: 1.0000 "application " | TOPICAL_OIL | Status: DC | PRN

## 2019-06-07 MED ORDER — OXYCODONE-ACETAMINOPHEN 5-325 MG PO TABS: 1.0000 | ORAL_TABLET | ORAL | Status: DC | PRN

## 2019-06-07 MED ORDER — MISOPROSTOL 200 MCG PO TABS
800.0000 ug | ORAL_TABLET | Freq: Once | ORAL | Status: AC
  Administered 2019-06-07: 800 ug via BUCCAL

## 2019-06-07 MED ORDER — OXYTOCIN 40 UNITS IN NORMAL SALINE INFUSION - SIMPLE MED
2.5000 [IU]/h | INTRAVENOUS | Status: DC
  Filled 2019-06-07: qty 1000

## 2019-06-07 MED ORDER — PHENYLEPHRINE 40 MCG/ML (10ML) SYRINGE FOR IV PUSH (FOR BLOOD PRESSURE SUPPORT): 80.0000 ug | PREFILLED_SYRINGE | INTRAVENOUS | Status: DC | PRN

## 2019-06-07 MED ORDER — SENNOSIDES-DOCUSATE SODIUM 8.6-50 MG PO TABS
2.0000 | ORAL_TABLET | ORAL | Status: DC
  Administered 2019-06-07 – 2019-06-08 (×2): 2 via ORAL
  Filled 2019-06-07 (×2): qty 2

## 2019-06-07 MED ORDER — SODIUM CHLORIDE (PF) 0.9 % IJ SOLN
INTRAMUSCULAR | Status: DC | PRN
  Administered 2019-06-07: 12 mL/h via EPIDURAL

## 2019-06-07 MED ORDER — OXYTOCIN 40 UNITS IN NORMAL SALINE INFUSION - SIMPLE MED: 1.0000 m[IU]/min | INTRAVENOUS | Status: DC

## 2019-06-07 MED ORDER — SOD CITRATE-CITRIC ACID 500-334 MG/5ML PO SOLN: 30.0000 mL | ORAL | Status: DC | PRN

## 2019-06-07 MED ORDER — TERBUTALINE SULFATE 1 MG/ML IJ SOLN: 0.2500 mg | Freq: Once | INTRAMUSCULAR | Status: DC | PRN

## 2019-06-07 MED ORDER — OXYTOCIN BOLUS FROM INFUSION
500.0000 mL | Freq: Once | INTRAVENOUS | Status: AC
  Administered 2019-06-07: 500 mL via INTRAVENOUS

## 2019-06-07 MED ORDER — TETANUS-DIPHTH-ACELL PERTUSSIS 5-2.5-18.5 LF-MCG/0.5 IM SUSP: 0.5000 mL | Freq: Once | INTRAMUSCULAR | Status: DC

## 2019-06-07 MED ORDER — DIPHENHYDRAMINE HCL 50 MG/ML IJ SOLN: 12.5000 mg | INTRAMUSCULAR | Status: DC | PRN

## 2019-06-07 MED ORDER — FENTANYL CITRATE (PF) 100 MCG/2ML IJ SOLN: 100.0000 ug | INTRAMUSCULAR | Status: DC | PRN

## 2019-06-07 MED ORDER — ACETAMINOPHEN 325 MG PO TABS
650.0000 mg | ORAL_TABLET | ORAL | Status: DC | PRN
  Administered 2019-06-08: 650 mg via ORAL
  Filled 2019-06-07: qty 2

## 2019-06-07 NOTE — Progress Notes (Signed)
Pt states FB that was placed yesterday fell out between 2-3 am. FP notified.

## 2019-06-07 NOTE — Progress Notes (Signed)
Patient ID: Samantha Harris, female   DOB: 1981/05/01, 38 y.o.   MRN: [id number]  Comfortable w/ epidural; changing sides regularly; has had some leaking  BP 124/70, P 78 FHR 120s, +accels, occ variables Ctx q 2-3 mins with Pit at 11mu/min Cx 5/80/vtx -2 per RN- no membrane felt  IUP@term  GDMA1 Latent labor  Plan to check in 2 hrs or sooner with pressure Anticipate SVD  Myrtis Ser [facility] 06/07/2019 6:48 PM

## 2019-06-07 NOTE — Anesthesia Preprocedure Evaluation (Addendum)
Anesthesia Evaluation  Patient identified by MRN, date of birth, ID band Patient awake    Reviewed: Allergy & Precautions, Patient's Chart, lab work & pertinent test results  History of Anesthesia Complications Negative for: history of anesthetic complications  Airway Mallampati: II  TM Distance: >3 FB Neck ROM: Full    Dental no notable dental hx.    Pulmonary former smoker,    Pulmonary exam normal        Cardiovascular negative cardio ROS Normal cardiovascular exam     Neuro/Psych  Headaches, Anxiety    GI/Hepatic negative GI ROS, Neg liver ROS,   Endo/Other  diabetes, GestationalObese, BMI 38  Renal/GU negative Renal ROS     Musculoskeletal negative musculoskeletal ROS (+)   Abdominal   Peds  Hematology negative hematology ROS (+)   Anesthesia Other Findings Day of surgery medications reviewed with the patient.  Reproductive/Obstetrics (+) Pregnancy                             Anesthesia Physical Anesthesia Plan  ASA: III  Anesthesia Plan: Epidural   Post-op Pain Management:    Induction:   PONV Risk Score and Plan: Treatment may vary due to age or medical condition  Airway Management Planned: Natural Airway  Additional Equipment:   Intra-op Plan:   Post-operative Plan:   Informed Consent: I have reviewed the patients History and Physical, chart, labs and discussed the procedure including the risks, benefits and alternatives for the proposed anesthesia with the patient or authorized representative who has indicated his/her understanding and acceptance.       Plan Discussed with: CRNA  Anesthesia Plan Comments:         Anesthesia Quick Evaluation

## 2019-06-07 NOTE — Anesthesia Procedure Notes (Signed)
Epidural Patient location during procedure: OB Start time: 06/07/2019 12:36 PM End time: 06/07/2019 12:39 PM  Staffing Anesthesiologist: Brennan Bailey, MD Performed: anesthesiologist   Preanesthetic Checklist Completed: patient identified, pre-op evaluation, timeout performed, IV checked, risks and benefits discussed and monitors and equipment checked  Epidural Patient position: sitting Prep: site prepped and draped and DuraPrep Patient monitoring: continuous pulse ox, blood pressure, heart rate and cardiac monitor Approach: midline Location: L3-L4 Injection technique: LOR air  Needle:  Needle type: Tuohy  Needle gauge: 17 G Needle length: 9 cm Needle insertion depth: 8 cm Catheter type: closed end flexible Catheter size: 19 Gauge Catheter at skin depth: 13 cm Test dose: negative and Other (1% lidocaine)  Assessment Events: blood not aspirated, injection not painful, no injection resistance, negative IV test and no paresthesia  Additional Notes Patient identified. Risks, benefits, and alternatives discussed with patient including but not limited to bleeding, infection, nerve damage, paralysis, failed block, incomplete pain control, headache, blood pressure changes, nausea, vomiting, reactions to medication, itching, and postpartum back pain. Confirmed with bedside nurse the patient's most recent platelet count. Confirmed with patient that they are not currently taking any anticoagulation, have any bleeding history, or any family history of bleeding disorders. Patient expressed understanding and wished to proceed. All questions were answered. Sterile technique was used throughout the entire procedure. Crisp LOR after one needle redirection. Please see nursing notes for vital signs. Test dose was given through epidural catheter and negative prior to continuing to dose epidural or start infusion. Warning signs of high block given to the patient including shortness of breath,  tingling/numbness in hands, complete motor block, or any concerning symptoms with instructions to call for help. Patient was given instructions on fall risk and not to get out of bed. All questions and concerns addressed with instructions to call with any issues or inadequate analgesia.  Reason for block:procedure for pain

## 2019-06-07 NOTE — H&P (Signed)
Samantha Harris is a 38 y.o. female [id number] @ 40.0wks by U/S presenting for IOL due to [place]. She had an outpatient cervical foley placed yesterday that came out at approx 2-3am. Denies leaking or reg ctx. No H/A, N/V or visual disturbances. Her preg has been followed by the [facility] service and has been remarkable for:  # GDMA1 (EFW 48% 6lb 10oz @ 38wks) # panic d/o # AMA  OB History    Gravida  6   Para  3   Term  3   Preterm      AB  2   Living  3     SAB      TAB  2   Ectopic      Multiple      Live Births  3          Past Medical History:  Diagnosis Date  . Anxiety   . Gestational diabetes   . HPV in female   . Migraine   . Vaginal Pap smear, abnormal    Past Surgical History:  Procedure Laterality Date  . MOUTH SURGERY     Family History: family history includes Asthma in her brother; Clotting disorder in her paternal grandmother; Colon cancer (age of onset: 93) in her mother; Diabetes in her paternal grandmother and sister; Heart disease in her father; Hypertension in her father; Stroke in her father and paternal grandmother. Social History:  reports that she quit smoking about 7 months ago. Her smoking use included cigarettes. She has never used smokeless tobacco. She reports that she does not drink alcohol or use drugs.     Maternal Diabetes: Yes:  Diabetes Type:  Diet controlled Genetic Screening: Normal Maternal Ultrasounds/Referrals: Normal Fetal Ultrasounds or other Referrals:  None Maternal Substance Abuse:  No Significant Maternal Medications:  None Significant Maternal Lab Results:  Lab values include: Group B Strep negative Other Comments:  None  ROS History Dilation: 4 Effacement (%): Thick Station: -3 Exam by:: J.Cox, RN Blood pressure 118/67, pulse 88, temperature 98.4 F (36.9 C), temperature source Oral, height 5\' 7"  (1.702 m), weight 109.8 kg, last menstrual period 08/04/2018, unknown if currently  breastfeeding. Exam Physical Exam  Constitutional: She is oriented to person, place, and time. She appears well-developed.  HENT:  Head: Normocephalic.  Neck: Normal range of motion.  Cardiovascular: Normal rate.  Respiratory: Effort normal.  GI:  EFM 135-140s, +accels, no decels, Cat 1 Irreg mild ctx  Musculoskeletal: Normal range of motion.  Neurological: She is alert and oriented to person, place, and time.  Skin: Skin is warm and dry.  Psychiatric: She has a normal mood and affect. Her behavior is normal. Thought content normal.    Prenatal labs: ABO, Rh: --/--/O POS (06/12 3546) Antibody: NEG (06/12 0742) Rubella: Immune (12/03 0000) RPR: Non Reactive (03/27 0925)  HBsAg: Negative (12/03 0000)  HIV: Non Reactive (03/27 0925)  GBS:   negative 05/10/19  Assessment/Plan: IUP@40 .0wks GDMA1 Cx now favorable after outpt cervical foley came out   Admit to Labor and Delivery Start Pitocin 2x2 Will get random CBG- if nl, likely won't repeat Plans epidural when labor increases Anticipate SVD   Myrtis Ser CNM 06/07/2019, 9:40 AM

## 2019-06-07 NOTE — Progress Notes (Signed)
Patient ID: Samantha Harris, female   DOB: 09/12/1981, 38 y.o.   MRN: [id number]  Epidural recently placed- comfortable; Pit stopped earlier due to recurrent variables (had been at 68mu/min)  BP 122/72, 88/54 on back FHR 130s, +accels, occ variables, Cat 1, +SS Ctx irreg 2-6 mins spont Cx 4+/50/vtx -2; unsure if AROM was successful?  IUP@40 .0wks IOL process GDMA1  Will restart Pit 1x1 for now, and will increase as FHR tolerates  Myrtis Ser CNM 06/07/2019 1:56 PM

## 2019-06-07 NOTE — Discharge Summary (Signed)
Postpartum Discharge Summary     Patient Name: Samantha Harris DOB: 24-Nov-1981 MRN: [id number]  Date of admission: 06/07/2019 Delivering Provider: Serita Grammes D   Date of discharge: 06/09/2019  Admitting diagnosis: No admission diagnoses are documented for this encounter. 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 yo [id number] admitted to the hospital 06/07/2019 for induction of labor.  Indication for induction: A1 DM. Patient had an uncomplicated labor course including an outpatient cervical foley placed on 06/06/19 which came out prior to her arrival at the hospital in the morning when Pitocin was started. She progressed well to SVD. Membrane Rupture Time/Date: 1:33 PM ,06/07/2019   Intrapartum Procedures: Episiotomy: None [1]                                         Lacerations:  None [1]  Patient had delivery of a Viable infant.  Information for the patient's newborn:  [name], Boy Samantha Harris[[id number]]Delivery Method: Vaginal, Spontaneous(Filed from Delivery Summary)    06/07/2019  Details of delivery can be found in separate delivery note.  Patient had a routine postpartum course. Patient is discharged home 06/09/19.  Magnesium Sulfate recieved: No BMZ received: No  Physical exam  Vitals:   06/08/19 0823 06/08/19 1400 06/08/19 2208 06/09/19 0517  BP:  93/63 112/60 121/72  Pulse:  79 75 70  Resp:   _0 Temp: 98.2 F (36.8 C) 98.2 F (36.8 C) 98 F (36.7 C) 97.9 F (36.6 C)  TempSrc: Oral Oral Oral Oral  SpO2:   100%   Weight:      Height:       General: alert, cooperative and no distress Lochia: appropriate Uterine Fundus: firm Incision: N/A DVT Evaluation: No evidence of DVT seen on physical exam. No cords or calf tenderness. No significant calf/ankle edema. Labs: Lab Results  Component Value Date   WBC 10.5 06/07/2019   HGB 10.1 (L) 06/07/2019   HCT 32.4 (L) 06/07/2019   MCV 75.0 (L) 06/07/2019   PLT 288 06/07/2019   CMP Latest Ref Rng & Units 05/24/2019  Glucose 65 - 99 mg/dL 108(H)  BUN 6 - 20 mg/dL 7  Creatinine 0.57 - 1.00 mg/dL 0.71  Sodium 134 - 144 mmol/L 139  Potassium 3.5 - 5.2 mmol/L 4.1  Chloride 96 - 106  mmol/L 103  CO2 20 - 29 mmol/L 20  Calcium 8.7 - 10.2 mg/dL 9.0  Total Protein 6.0 - 8.5 g/dL 5.9(L)  Total Bilirubin 0.0 - 1.2 mg/dL <0.2  Alkaline Phos 39 - 117 IU/L 97  AST 0 - 40 IU/L 10  ALT 0 - 32 IU/L 7    Discharge instruction: per After Visit Summary and "Baby and Me Booklet".  After visit meds:  Allergies as of 06/09/2019   No Known Allergies     Medication List    STOP taking these medications   Accu-Chek FastClix Lancets Misc   Accu-Chek Guide w/Device Kit   aspirin EC 81 MG tablet   Comfort Fit Maternity Supp Lg Misc   Comfort Fit Maternity Supp Sm Misc   famotidine 20 MG tablet Commonly known as: PEPCID   ferrous sulfate 325 (65 FE) MG tablet   glucose blood test strip Commonly known as: Accu-Chek Guide   ondansetron 4 MG disintegrating tablet Commonly known as: Zofran ODT   polyethylene glycol 17 g packet Commonly known as: MiraLax     TAKE these medications   calcium carbonate 500 MG chewable tablet Commonly known as: TUMS - dosed in mg elemental calcium Chew 2 tablets by mouth 2 (two) times daily as needed for indigestion or heartburn.   CitraNatal Bloom 90-1 MG Tabs Use as directed 1 tablet  in the mouth or throat daily.   fluticasone 50 MCG/ACT nasal spray Commonly known as: FLONASE Place 1 spray into both nostrils daily. What changed:   when to take this  reasons to take this   ibuprofen 600 MG tablet Commonly known as: ADVIL Take 1 tablet (600 mg total) by mouth every 6 (six) hours.   TYLENOL 500 MG tablet Generic drug: acetaminophen Take 1,000 mg by mouth every 8 (eight) hours as needed for mild pain or headache.       Diet: routine diet  Activity: Advance as tolerated. Pelvic rest for 6 weeks.   Outpatient follow up:6 weeks w/ GTT Follow up Appt: Future Appointments  Date Time Provider [place]  07/05/2019  9:45 AM Emily Filbert, MD [facility] None   Follow up Visit: [place] Follow up on 07/05/2019.   Specialty: Obstetrics and Gynecology Why: As scheduled for routine PP visit on 7/10 Contact information: [address], [place] [phone]          Please schedule this patient for Postpartum visit in: 6 weeks with the following provider: Any provider For C/S patients schedule nurse incision check in weeks 2 weeks: no High risk pregnancy complicated by: GDM Delivery mode:  SVD Anticipated Birth Control:  other/unsure PP Procedures needed: 2 hour GTT  Schedule Integrated BH visit: no  Newborn Data: Live born female  Birth Weight: 7 lb 14.6 oz (3589 g) APGAR: 9, 9  Newborn Delivery   Birth date/time: 06/07/2019 19:43:00 Delivery type: Vaginal, Spontaneous      Baby Feeding: Bottle and Breast Disposition:home with mother   06/09/2019 Samantha Hough, PA-C

## 2019-06-08 NOTE — Progress Notes (Signed)
[name] was referred for history of depression/anxiety. * Referral screened out by Clinical Social Worker because none of the following criteria appear to apply: ~ History of anxiety/depression during this pregnancy, or of post-partum depression following prior delivery. ~ Diagnosis of anxiety and/or depression within last 3 years; no concerns noted in [facility] records.  OR * [name]'s symptoms currently being treated with medication and/or therapy.  Please contact the Clinical Social Worker if needs arise, by [facility] request, or if [name] scores greater than 9/yes to question 10 on [place] Postpartum Depression Screen.  Laurey Arrow, MSW, LCSW Clinical Social Work [phone]

## 2019-06-08 NOTE — Anesthesia Postprocedure Evaluation (Signed)
Anesthesia Post Note  Patient: Samantha Harris  Procedure(s) Performed: AN AD HOC LABOR EPIDURAL     Patient location during evaluation: Mother Baby Anesthesia Type: Epidural Level of consciousness: awake Pain management: satisfactory to patient Vital Signs Assessment: post-procedure vital signs reviewed and stable Respiratory status: spontaneous breathing Cardiovascular status: stable Anesthetic complications: no    Last Vitals:  Vitals:   06/08/19 0720 06/08/19 0823  BP: 118/61   Pulse: 60   Resp: 18   Temp: 37.4 C 36.8 C  SpO2: 100%     Last Pain:  Vitals:   06/08/19 0824  TempSrc:   PainSc: 4    Pain Goal:                   Casimer Lanius

## 2019-06-08 NOTE — Lactation Note (Signed)
This note was copied from a baby's chart. Lactation Consultation Note  Patient Name: Samantha Harris Today's Date: 06/08/2019 Reason for consult: Term;Follow-up assessment(GDM; RN request)  2016 - 2048 - RN requested I help Samantha Harris. Baby is latching well, but there are concerns regarding intake due to mom's GDM and baby's low blood sugar readings from earlier in the day. Baby has been a bit sleepy today with some long gaps in feeding.  I helped mom hand express and then latch baby "Samantha Harris" to her right breast. She has more difficulty latching to this side, and her nipple is dimpled and short (but evert). I helped her latch her son in football hold. Rythmic suckling sequences noted, and he fed for several minutes before releasing the breast. He became fussy and would not latch again to this side, so we moved him to cradle hold on mom's right breast. She latched him with more confidence on this side, and baby fed well.  Mom has breast shells, but she has not used them. I showed her how they work. I also suggested that she pre-pump to help evert her right nipple prior to latching AND to post-pump on that side for stimulation (since this side has been under-utilized). I encouraged more frequent feeding due to maternal and infant risk factors and now that we are in day 2. I also  encouraged her to wake baby to feed.  Samantha Harris verbalized understanding of this plan.   Maternal Data Formula Feeding for Exclusion: No Has patient been taught Hand Expression?: Yes Does the patient have breastfeeding experience prior to this delivery?: Yes  Feeding Feeding Type: Breast Fed  LATCH Score Latch: Grasps breast easily, tongue down, lips flanged, rhythmical sucking.  Audible Swallowing: Spontaneous and intermittent  Type of Nipple: Everted at rest and after stimulation  Comfort (Breast/Nipple): Soft / non-tender  Hold (Positioning): Assistance needed to correctly position infant at breast and  maintain latch.  LATCH Score: 9  Interventions Interventions: Breast feeding basics reviewed;Assisted with latch;Hand express;Pre-pump if needed;Breast compression;Adjust position;Support pillows;Position options;Shells;Hand pump  Lactation Tools Discussed/Used Tools: Pump;Shells Breast pump type: Manual Pump Review: Setup, frequency, and cleaning Initiated by:: hl Date initiated:: 06/08/19   Consult Status Consult Status: Follow-up Date: 06/09/19 Follow-up type: In-patient    Samantha Harris 06/08/2019, 11:16 PM

## 2019-06-08 NOTE — Progress Notes (Signed)
[name] was referred for history of depression/anxiety. * Referral screened out by Clinical Social Worker because none of the following criteria appear to apply: ~ History of anxiety/depression during this pregnancy, or of post-partum depression following prior delivery. ~ Diagnosis of anxiety and/or depression within last 3 years. Per [name] record [name] was dx in 2012.  There are no concerns noted in [facility] record. OR * [name]'s symptoms currently being treated with medication and/or therapy.  Please contact the Clinical Social Worker if needs arise, by [facility] request, or if [name] scores greater than 9/yes to question 10 on [place] Postpartum Depression Screen.  Laurey Arrow, MSW, LCSW Clinical Social Work [phone]

## 2019-06-08 NOTE — Lactation Note (Addendum)
This note was copied from a baby's chart. Lactation Consultation Note  Patient Name: Boy Bekka Busker Today's Date: 06/08/2019   P4, Baby 51 hours old. Mother bf her first 2 children for approx one week and her 3rd child for 4 mos. Mother states she had chills after birth and decided to give baby formula but plans mostly to bf. She is happy this baby per mother is latching well especially on L side. Her nipple on R side is evert but mother pointed out it inverts in the center. Offered to teach mother hand expression but mother states she would prefer to wait to work with lactation at a later time.  Lactatoin to follow up.  LC followed up with mother and she was eating her lunch and requested LC come back later.       Maternal Data    Feeding    LATCH Score                   Interventions    Lactation Tools Discussed/Used     Consult Status      Carlye Grippe 06/08/2019, 12:35 PM

## 2019-06-08 NOTE — Progress Notes (Signed)
Post Partum Day 1  Subjective: Up ad lib, voiding, tolerating PO, and + flatus. Denies dizziness, lightheadedness, tachycardia. Appropriate Lochia. Pain well controlled.  Objective: BP (!) 111/51   Pulse 70   Temp 99.9 F (37.7 C) (Oral)   Resp 18   Ht 5\' 7"  (1.702 m)   Wt 109.8 kg   LMP 08/04/2018   SpO2 100%   Breastfeeding Unknown   BMI 37.90 kg/m  Intake/Output      06/12 0701 - 06/13 0700 06/13 0701 - 06/14 0700   I.V. (mL/kg) 1615.5 (14.7)    Total Intake(mL/kg) 1615.5 (14.7)    Urine (mL/kg/hr) 1075    Blood 375    Total Output 1450    Net +165.5           Physical Exam:  General: alert, cooperative, appears stated age, fatigued, and no distress Lungs: CTAB, no crackles or wheezes Heart: RRR, no m/r/r.  Lochia: appropriate Uterine Fundus: firm DVT Evaluation: No evidence of DVT seen on physical exam, extremities warm and well perfused.   Recent Labs    06/07/19 0734  HGB 10.1*  HCT 32.4*   Assessment/Plan: Postpartum Day # 1 . Plan for d/c tomorrow  . Continue daily lactation  . Contraception: discussed and patient undecided  . Circumcision: yes. In/out?    LOS: 1 day   Samantha Harris 06/08/2019, 7:02 AM

## 2019-06-09 MED ORDER — IBUPROFEN 600 MG PO TABS: 600.0000 mg | ORAL_TABLET | Freq: Four times a day (QID) | ORAL | 0 refills | Status: DC

## 2019-06-09 NOTE — Discharge Instructions (Signed)
Contraception Choices °Contraception, also called birth control, means things to use or ways to try not to get pregnant. °Hormonal birth control °This kind of birth control uses hormones. Here are some types of hormonal birth control: °· A tube that is put under skin of the arm (implant). The tube can stay in for as long as 3 years. °· Shots to get every 3 months (injections). °· Pills to take every day (birth control pills). °· A patch to change 1 time each week for 3 weeks (birth control patch). After that, the patch is taken off for 1 week. °· A ring to put in the vagina. The ring is left in for 3 weeks. Then it is taken out of the vagina for 1 week. Then a new ring is put in. °· Pills to take after unprotected sex (emergency birth control pills). °Barrier birth control °Here are some types of barrier birth control: °· A thin covering that is put on the penis before sex (female condom). The covering is thrown away after sex. °· A soft, loose covering that is put in the vagina before sex (female condom). The covering is thrown away after sex. °· A rubber bowl that sits over the cervix (diaphragm). The bowl must be made for you. The bowl is put into the vagina before sex. The bowl is left in for 6-8 hours after sex. It is taken out within 24 hours. °· A small, soft cup that fits over the cervix (cervical cap). The cup must be made for you. The cup can be left in for 6-8 hours after sex. It is taken out within 48 hours. °· A sponge that is put into the vagina before sex. It must be left in for at least 6 hours after sex. It must be taken out within 30 hours. Then it is thrown away. °· A chemical that kills or stops sperm from getting into the uterus (spermicide). It may be a pill, cream, jelly, or foam to put in the vagina. The chemical should be used at least 10-15 minutes before sex. °IUD (intrauterine) birth control °An IUD is a small, T-shaped piece of plastic. It is put inside the uterus. There are two  kinds: °· Hormone IUD. This kind can stay in for 3-5 years. °· Copper IUD. This kind can stay in for 10 years. °Permanent birth control °Here are some types of permanent birth control: °· Surgery to block the fallopian tubes. °· Having an insert put into each fallopian tube. °· Surgery to tie off the tubes that carry sperm (vasectomy). °Natural planning birth control °Here are some types of natural planning birth control: °· Not having sex on the days the woman could get pregnant. °· Using a calendar: °? To keep track of the length of each period. °? To find out what days pregnancy can happen. °? To plan to not have sex on days when pregnancy can happen. °· Watching for symptoms of ovulation and not having sex during ovulation. One way the woman can check for ovulation is to check her temperature. °· Waiting to have sex until after ovulation. °Summary °· Contraception, also called birth control, means things to use or ways to try not to get pregnant. °· Hormonal methods of birth control include implants, injections, pills, patches, vaginal rings, and emergency birth control pills. °· Barrier methods of birth control can include female condoms, female condoms, diaphragms, cervical caps, sponges, and spermicides. °· There are two types of IUD (intrauterine device) birth control.   An IUD can be put in a woman's uterus to prevent pregnancy for 3-5 years. °· Permanent sterilization can be done through a procedure for males, females, or both. °· Natural planning methods involve not having sex on the days when the woman could get pregnant. °This information is not intended to replace advice given to you by your health care provider. Make sure you discuss any questions you have with your health care provider. °Document Released: 10/09/2009 Document Revised: 07/19/2018 Document Reviewed: 12/22/2016 °Elsevier Interactive Patient Education © 2019 [employer] °Vaginal Delivery, Care After °Refer to this sheet in the next few  weeks. These instructions provide you with information about caring for yourself after vaginal delivery. Your health care provider may also give you more specific instructions. Your treatment has been planned according to current medical practices, but problems sometimes occur. Call your health care provider if you have any problems or questions. °What can I expect after the procedure? °After vaginal delivery, it is common to have: °· Some bleeding from your vagina. °· Soreness in your abdomen, your vagina, and the area of skin between your vaginal opening and your anus (perineum). °· Pelvic cramps. °· Fatigue. °Follow these instructions at home: °Medicines °· Take over-the-counter and prescription medicines only as told by your health care provider. °· If you were prescribed an antibiotic medicine, take it as told by your health care provider. Do not stop taking the antibiotic until it is finished. °Driving ° °· Do not drive or operate heavy machinery while taking prescription pain medicine. °· Do not drive for 24 hours if you received a sedative. °Lifestyle °· Do not drink alcohol. This is especially important if you are breastfeeding or taking medicine to relieve pain. °· Do not use tobacco products, including cigarettes, chewing tobacco, or e-cigarettes. If you need help quitting, ask your health care provider. °Eating and drinking °· Drink at least 8 eight-ounce glasses of water every day unless you are told not to by your health care provider. If you choose to breastfeed your baby, you may need to drink more water than this. °· Eat high-fiber foods every day. These foods may help prevent or relieve constipation. High-fiber foods include: °? Whole grain cereals and breads. °? Brown rice. °? Beans. °? Fresh fruits and vegetables. °Activity °· Return to your normal activities as told by your health care provider. Ask your health care provider what activities are safe for you. °· Rest as much as possible. Try to  rest or take a nap when your baby is sleeping. °· Do not lift anything that is heavier than your baby or 10 lb (4.5 kg) until your health care provider says that it is safe. °· Talk with your health care provider about when you can engage in sexual activity. This may depend on your: °? Risk of infection. °? Rate of healing. °? Comfort and desire to engage in sexual activity. °Vaginal Care °· If you have an episiotomy or a vaginal tear, check the area every day for signs of infection. Check for: °? More redness, swelling, or pain. °? More fluid or blood. °? Warmth. °? Pus or a bad smell. °· Do not use tampons or douches until your health care provider says this is safe. °· Watch for any blood clots that may pass from your vagina. These may look like clumps of dark red, brown, or black discharge. °General instructions °· Keep your perineum clean and dry as told by your health care provider. °· Wear loose,   comfortable clothing. °· Wipe from front to back when you use the toilet. °· Ask your health care provider if you can shower or take a bath. If you had an episiotomy or a perineal tear during labor and delivery, your health care provider may tell you not to take baths for a certain length of time. °· Wear a bra that supports your breasts and fits you well. °· If possible, have someone help you with household activities and help care for your baby for at least a few days after you leave the hospital. °· Keep all follow-up visits for you and your baby as told by your health care provider. This is important. °Contact a health care provider if: °· You have: °? Vaginal discharge that has a bad smell. °? Difficulty urinating. °? Pain when urinating. °? A sudden increase or decrease in the frequency of your bowel movements. °? More redness, swelling, or pain around your episiotomy or vaginal tear. °? More fluid or blood coming from your episiotomy or vaginal tear. °? Pus or a bad smell coming from your episiotomy or vaginal  tear. °? A fever. °? A rash. °? Little or no interest in activities you used to enjoy. °? Questions about caring for yourself or your baby. °· Your episiotomy or vaginal tear feels warm to the touch. °· Your episiotomy or vaginal tear is separating or does not appear to be healing. °· Your breasts are painful, hard, or turn red. °· You feel unusually sad or worried. °· You feel nauseous or you vomit. °· You pass large blood clots from your vagina. If you pass a blood clot from your vagina, save it to show to your health care provider. Do not flush blood clots down the toilet without having your health care provider look at them. °· You urinate more than usual. °· You are dizzy or light-headed. °· You have not breastfed at all and you have not had a menstrual period for 12 weeks after delivery. °· You have stopped breastfeeding and you have not had a menstrual period for 12 weeks after you stopped breastfeeding. °Get help right away if: °· You have: °? Pain that does not go away or does not get better with medicine. °? Chest pain. °? Difficulty breathing. °? Blurred vision or spots in your vision. °? Thoughts about hurting yourself or your baby. °· You develop pain in your abdomen or in one of your legs. °· You develop a severe headache. °· You faint. °· You bleed from your vagina so much that you fill two sanitary pads in one hour. °This information is not intended to replace advice given to you by your health care provider. Make sure you discuss any questions you have with your health care provider. °Document Released: 12/09/2000 Document Revised: 05/25/2016 Document Reviewed: 12/27/2015 °Elsevier Interactive Patient Education © 2019 Elsevier Inc. ° °

## 2019-06-09 NOTE — Lactation Note (Signed)
This note was copied from a baby's chart. Lactation Consultation Note  Patient Name: Samantha Harris Today's Date: 06/09/2019 Reason for consult: Term;Infant weight loss  Baby is 37 hours  As LC entered the room baby was wide awake and fussy.  Per mom had fed around 7 am.  LC checked the diaper and it was dry.  LC offered to assist to latch and mom receptive.  Mom mentioned she was having difficulty latching on the right breast.  The left breast he is breast feeding well.  [place] semi compressible on the right side and LC had mom roll the nipple/ right breast / football.  And then [facility] showed her reverse pressure and the tissue was more compressible.  Baby latched with assist but due to lack of flow would not sustain latch.  LC added a 10F SNS and baby was latched/ baby tolerated well and took 24 ml and fed after SNS was completed For about 10 mins - 15 -20 mins total. Per mom felt a comfortable tug and the nipple was well rounded after baby  Released.  Mom denies soreness / mentioned her breast are fuller and heavier.  Sore nipple and engorgement prevention and tx reviewed.  LC stressed the importance of wearing the shells 10 mins prior to feed or in between except when sleeping.  Prior to latch until the latching improves with consistency - breast massage, hand express, pre-pump with  Hand pump or HAAKA , reverse pressure.  Latch with firm support and breast compressions.  LC reminded mom to work on not allowing the baby top nibble his way onto the breast.  Mom has a hand pump, a HAAKA and a DEBP Avent pump at home.  LC recommended when the baby is not cluster feeding to add some extra post pumping until her milk comes in .  [facility] reviewed [place] Lactation resources after D/C.  LC offered to request and LC O/P appt. And mom opted to call back if needed.       Maternal Data Has patient been taught Hand Expression?: Yes  Feeding Feeding Type: Breast Milk with Formula  added  LATCH Score Latch: Repeated attempts needed to sustain latch, nipple held in mouth throughout feeding, stimulation needed to elicit sucking reflex.  Audible Swallowing: Spontaneous and intermittent  Type of Nipple: Everted at rest and after stimulation  Comfort (Breast/Nipple): Soft / non-tender  Hold (Positioning): Assistance needed to correctly position infant at breast and maintain latch.  LATCH Score: 8  Interventions Interventions: Breast feeding basics reviewed;Assisted with latch;Skin to skin;Breast massage;Hand express;Breast compression;Adjust position;Support pillows;Position options;Shells;Hand pump  Lactation Tools Discussed/Used Tools: Shells;Pump Shell Type: Inverted Breast pump type: Manual WIC Program: Yes Pump Review: Milk Storage   Consult Status Consult Status: Complete Date: 06/09/19    Jerlyn Ly Kruz Chiu 06/09/2019, 9:22 AM

## 2019-06-25 ENCOUNTER — Telehealth (HOSPITAL_COMMUNITY): Payer: Self-pay

## 2019-06-25 NOTE — Telephone Encounter (Signed)
Mother phoned in and has a concern about her milk supply.  Mother is bottle feeding formula/ebm and breastfeeding. Mother is breastfeeding only on the left breast She has not been pumping.  Mother reports that she has started to pump today. Mother is obtaining 3 ounces with each pumping. She has been pumping for 40 mins every 2 hours.  Her plan is to pump after each feeding .   Mother was given lots of support and teaching on supply and demand.  She plans to continue to pump every 2-3 hours for 20-30  Mins.  Encouraged mother to do frequent hand expression and breast massage. Advised allowing infant to nuzzle frequently after breastfeeding.  Discussed supplements to increase milk supply. Mother was given the name of fenugreek and or Mothers Milk Plus.   Mother to follow plan and will call for for more concerns.

## 2019-07-05 ENCOUNTER — Ambulatory Visit (INDEPENDENT_AMBULATORY_CARE_PROVIDER_SITE_OTHER): Payer: Medicaid Other | Admitting: Obstetrics & Gynecology

## 2019-07-05 DIAGNOSIS — Z1389 Encounter for screening for other disorder: Secondary | ICD-10-CM

## 2019-07-05 NOTE — Progress Notes (Signed)
I connected with  Samantha Harris on 07/05/19 by a video enabled telemedicine application and verified that I am speaking with the correct person using two identifiers.   I discussed the limitations of evaluation and management by telemedicine. The patient expressed understanding and agreed to proceed.   Subjective:     Samantha Harris is a 38 y.o. female P4 who presents for a postpartum visit. She is 4 weeks postpartum following a spontaneous vaginal delivery. I have fully reviewed the prenatal and intrapartum course. The delivery was at 40 gestational weeks. Outcome: spontaneous vaginal delivery. Anesthesia: epidural. Postpartum course has been Unremarkable. Baby's course has been Unremarkable. Baby is feeding by both breast and bottle - Jerlyn Ly Start. Bleeding staining only. Bowel function is normal. Bladder function is normal. Patient is not sexually active. Contraception method is none. Postpartum depression screening: negative=2  The following portions of the patient's history were reviewed and updated as appropriate: allergies, current medications, past family history, past medical history, past social history, past surgical history and problem list.  Review of Systems Pertinent items are noted in HPI.  She is back in nursing school. Hemorrhoids have resolved.  Objective:    LMP 08/04/2018    Assessment:     Normal postpartum exam. Pap smear not done at today's visit.   Plan:    1. Contraception: none 2. Will have pap smear and 2 hour GTT 3. She declines contraception at this time, considering options

## 2019-08-02 ENCOUNTER — Other Ambulatory Visit: Payer: Self-pay

## 2019-08-02 ENCOUNTER — Other Ambulatory Visit: Payer: Medicaid Other

## 2019-08-02 ENCOUNTER — Ambulatory Visit (INDEPENDENT_AMBULATORY_CARE_PROVIDER_SITE_OTHER): Payer: Medicaid Other | Admitting: Obstetrics

## 2019-08-02 ENCOUNTER — Other Ambulatory Visit (HOSPITAL_COMMUNITY)
Admission: RE | Admit: 2019-08-02 | Discharge: 2019-08-02 | Disposition: A | Discharge status: Home / Self Care | Payer: Medicaid Other | Source: Ambulatory Visit | Attending: Obstetrics | Admitting: Obstetrics

## 2019-08-02 ENCOUNTER — Encounter: Payer: Self-pay | Admitting: Obstetrics

## 2019-08-02 DIAGNOSIS — Z3202 Encounter for pregnancy test, result negative: Secondary | ICD-10-CM

## 2019-08-02 DIAGNOSIS — Z3009 Encounter for other general counseling and advice on contraception: Secondary | ICD-10-CM

## 2019-08-02 DIAGNOSIS — Z30011 Encounter for initial prescription of contraceptive pills: Secondary | ICD-10-CM

## 2019-08-02 LAB — POCT URINE PREGNANCY: Preg Test, Ur: NEGATIVE

## 2019-08-02 MED ORDER — NORETHIN ACE-ETH ESTRAD-FE 1-20 MG-MCG(24) PO TABS: 1.0000 | ORAL_TABLET | Freq: Every day | ORAL | 11 refills | Status: DC

## 2019-08-02 NOTE — Progress Notes (Signed)
Patient is in the office for pp pap smear. PP visit completed on 07-05-19. [place] = 0  Last pap May 2019 at [facility] was scheduled for pp glucose testing, but ate a bowl of cereal at 5am.

## 2019-08-02 NOTE — Addendum Note (Signed)
Addended by: Tristan Schroeder D on: 08/02/2019 09:41 AM   Modules accepted: Orders

## 2019-08-02 NOTE — Progress Notes (Signed)
Subjective:        Samantha Harris is a 38 y.o. female here for a routine exam.  Current complaints: None  Personal health questionnaire:   Is patient [name], have a family history of breast and/or ovarian cancer: no Is there a family history of uterine cancer diagnosed at age < 88, gastrointestinal cancer, urinary tract cancer, family member who is a Field seismologist syndrome-associated carrier: no Is the patient overweight and hypertensive, family history of diabetes, personal history of gestational diabetes, preeclampsia or PCOS: no Is patient over 49, have PCOS,  family history of premature CHD under age 62, diabetes, smoke, have hypertension or peripheral artery disease:  no At any time, has a partner hit, kicked or otherwise hurt or frightened you?: no Over the past 2 weeks, have you felt down, depressed or hopeless?: no Over the past 2 weeks, have you felt little interest or pleasure in doing things?:no   Gynecologic History Patient's last menstrual period was 08/04/2018. Contraception: none Last Pap: 2019. Results were: normal Last mammogram: n/a. Results were: n/a  Obstetric History OB History  Gravida Para Term Preterm AB Living  6 4 4   2 4   SAB TAB Ectopic Multiple Live Births    2   0 4    # Outcome Date GA Lbr Len/2nd Weight Sex Delivery Anes PTL Lv  6 Term 06/07/19 [redacted]w[redacted]d 07:37 / 00:06 7 lb 14.6 oz (3.589 kg) M Vag-Spont EPI  LIV  5 Term 12/27/12 [redacted]w[redacted]d 12:33 / 00:21 8 lb 1.3 oz (3.665 kg) M Vag-Spont EPI  LIV     Birth Comments: Passed hearing screen Deferred Hep B  4 TAB 09/11/09 [redacted]w[redacted]d         3 TAB 08/11/08 [redacted]w[redacted]d         2 Term 05/07/05 [redacted]w[redacted]d 24:00 8 lb 2 oz (3.685 kg) M Vag-Spont EPI N LIV  1 Term 03/25/03 [redacted]w[redacted]d 24:00 7 lb 4 oz (3.289 kg) M Vag-Spont EPI  LIV    Past Medical History:  Diagnosis Date  . Anxiety   . Gestational diabetes   . HPV in female   . Migraine   . Vaginal Pap smear, abnormal     Past Surgical History:  Procedure Laterality  Date  . MOUTH SURGERY       Current Outpatient Medications:  .  ferrous sulfate 325 (65 FE) MG tablet, Take 325 mg by mouth daily with breakfast., Disp: , Rfl:  .  acetaminophen (TYLENOL) 500 MG tablet, Take 1,000 mg by mouth every 8 (eight) hours as needed for mild pain or headache. , Disp: , Rfl:  .  calcium carbonate (TUMS - DOSED IN MG ELEMENTAL CALCIUM) 500 MG chewable tablet, Chew 2 tablets by mouth 2 (two) times daily as needed for indigestion or heartburn., Disp: , Rfl:  .  fluticasone (FLONASE) 50 MCG/ACT nasal spray, Place 1 spray into both nostrils daily. (Patient taking differently: Place 1 spray into both nostrils daily as needed for allergies or rhinitis. ), Disp: 16 g, Rfl: 2 .  ibuprofen (ADVIL) 600 MG tablet, Take 1 tablet (600 mg total) by mouth every 6 (six) hours. (Patient not taking: Reported on 08/02/2019), Disp: 30 tablet, Rfl: 0 .  Norethindrone Acetate-Ethinyl Estrad-FE (LOESTRIN 24 FE) 1-20 MG-MCG(24) tablet, Take 1 tablet by mouth daily., Disp: 1 Package, Rfl: 11 .  Prenatal-DSS-FeCb-FeGl-FA (CITRANATAL BLOOM) 90-1 MG TABS, Use as directed 1 tablet in the mouth or throat daily. (Patient not taking: Reported on 08/02/2019), Disp:  30 tablet, Rfl: 3 No Known Allergies  Social History   Tobacco Use  . Smoking status: Former Smoker    Types: Cigarettes    Quit date: 10/2018    Years since quitting: 0.7  . Smokeless tobacco: Never Used  Substance Use Topics  . Alcohol use: No    Comment: before pregnancy    Family History  Problem Relation Age of Onset  . Colon cancer Mother [id number]  . Heart disease Father   . Hypertension Father   . Stroke Father   . Diabetes Sister   . Asthma Brother   . Diabetes Paternal Grandmother   . Stroke Paternal Grandmother   . Clotting disorder Paternal Grandmother       Review of Systems  Constitutional: negative for fatigue and weight loss Respiratory: negative for cough and wheezing Cardiovascular: negative for chest pain, fatigue  and palpitations Gastrointestinal: negative for abdominal pain and change in bowel habits Musculoskeletal:negative for myalgias Neurological: negative for gait problems and tremors Behavioral/Psych: negative for abusive relationship, depression Endocrine: negative for temperature intolerance    Genitourinary:negative for abnormal menstrual periods, genital lesions, hot flashes, sexual problems and vaginal discharge Integument/breast: negative for breast lump, breast tenderness, nipple discharge and skin lesion(s)    Objective:       BP 134/84   Pulse 75   Temp (!) 97.5 F (36.4 C) (Oral)   Ht 5\' 7"  (1.702 m)   Wt 236 lb 3.2 oz (107.1 kg)   LMP 08/04/2018   Breastfeeding No   BMI 36.99 kg/m  General:   alert  Skin:   no rash or abnormalities  Lungs:   clear to auscultation bilaterally  Heart:   regular rate and rhythm, S1, S2 normal, no murmur, click, rub or gallop  Breasts:   normal without suspicious masses, skin or nipple changes or axillary nodes  Abdomen:  normal findings: no organomegaly, soft, non-tender and no hernia  Pelvis:  External genitalia: normal general appearance Urinary system: urethral meatus normal and bladder without fullness, nontender Vaginal: normal without tenderness, induration or masses Cervix: normal appearance Adnexa: normal bimanual exam Uterus: anteverted and non-tender, normal size   Lab Review Urine pregnancy test Labs reviewed yes Radiologic studies reviewed no  50% of 25 min visit spent on counseling and coordination of care.   Assessment:     1. Postpartum care following vaginal delivery Rx: - Cytology - PAP( [facility])  2. Encounter for other general counseling or advice on contraception - wants OCP's  3. Encounter for initial prescription of contraceptive pills Rx: - Norethindrone Acetate-Ethinyl Estrad-FE (LOESTRIN 24 FE) 1-20 MG-MCG(24) tablet; Take 1 tablet by mouth daily.  Dispense: 1 Package; Refill: 11    Plan:     Education reviewed: calcium supplements, depression evaluation, low fat, low cholesterol diet, safe sex/STD prevention, self breast exams and weight bearing exercise. Contraception: OCP (estrogen/progesterone). Follow up in: 1 year.   Meds ordered this encounter  Medications  . Norethindrone Acetate-Ethinyl Estrad-FE (LOESTRIN 24 FE) 1-20 MG-MCG(24) tablet    Sig: Take 1 tablet by mouth daily.    Dispense:  1 Package    Refill:  11   No orders of the defined types were placed in this encounter.  Brock BadHARLES A. Yaxiel Minnie MD 08-02-2019

## 2019-08-05 LAB — CYTOLOGY - PAP
Diagnosis: NEGATIVE
HPV: NOT DETECTED

## 2019-08-08 ENCOUNTER — Other Ambulatory Visit: Payer: Self-pay | Admitting: Obstetrics

## 2019-08-08 DIAGNOSIS — N760 Acute vaginitis: Secondary | ICD-10-CM

## 2019-08-08 DIAGNOSIS — B9689 Other specified bacterial agents as the cause of diseases classified elsewhere: Secondary | ICD-10-CM

## 2019-08-08 LAB — CERVICOVAGINAL ANCILLARY ONLY
Candida vaginitis: NEGATIVE
Chlamydia: NEGATIVE
Neisseria Gonorrhea: NEGATIVE
Trichomonas: NEGATIVE

## 2019-08-08 MED ORDER — TINIDAZOLE 500 MG PO TABS: 1000.0000 mg | ORAL_TABLET | Freq: Every day | ORAL | 2 refills | Status: DC

## 2019-08-09 ENCOUNTER — Other Ambulatory Visit: Payer: Self-pay

## 2019-08-09 DIAGNOSIS — N760 Acute vaginitis: Secondary | ICD-10-CM

## 2019-08-09 DIAGNOSIS — B9689 Other specified bacterial agents as the cause of diseases classified elsewhere: Secondary | ICD-10-CM

## 2019-08-09 MED ORDER — METRONIDAZOLE 0.75 % VA GEL: 1.0000 | Freq: Every day | VAGINAL | 1 refills | Status: DC

## 2019-08-20 ENCOUNTER — Other Ambulatory Visit: Payer: Medicaid Other

## 2019-09-04 ENCOUNTER — Encounter: Payer: Self-pay | Admitting: Obstetrics

## 2019-09-04 ENCOUNTER — Other Ambulatory Visit: Payer: Medicaid Other

## 2019-10-14 ENCOUNTER — Other Ambulatory Visit: Payer: Self-pay

## 2019-10-14 ENCOUNTER — Encounter: Payer: Self-pay | Admitting: Obstetrics and Gynecology

## 2019-10-14 ENCOUNTER — Ambulatory Visit: Payer: Medicaid Other | Admitting: Obstetrics and Gynecology

## 2019-10-14 VITALS — BP 138/83 | HR 97 | Ht 67.0 in | Wt 245.0 lb

## 2019-10-14 DIAGNOSIS — Z3009 Encounter for other general counseling and advice on contraception: Secondary | ICD-10-CM

## 2019-10-14 NOTE — Patient Instructions (Signed)
Etonogestrel implant What is this medicine? ETONOGESTREL (et oh noe JES trel) is a contraceptive (birth control) device. It is used to prevent pregnancy. It can be used for up to 3 years. This medicine may be used for other purposes; ask your health care provider or pharmacist if you have questions. COMMON BRAND NAME(S): Implanon, Nexplanon What should I tell my health care provider before I take this medicine? They need to know if you have any of these conditions:  abnormal vaginal bleeding  blood vessel disease or blood clots  breast, cervical, endometrial, ovarian, liver, or uterine cancer  diabetes  gallbladder disease  heart disease or recent heart attack  high blood pressure  high cholesterol or triglycerides  kidney disease  liver disease  migraine headaches  seizures  stroke  tobacco smoker  an unusual or allergic reaction to etonogestrel, anesthetics or antiseptics, other medicines, foods, dyes, or preservatives  pregnant or trying to get pregnant  breast-feeding How should I use this medicine? This device is inserted just under the skin on the inner side of your upper arm by a health care professional. Talk to your pediatrician regarding the use of this medicine in children. Special care may be needed. Overdosage: If you think you have taken too much of this medicine contact a poison control center or emergency room at once. NOTE: This medicine is only for you. Do not share this medicine with others. What if I miss a dose? This does not apply. What may interact with this medicine? Do not take this medicine with any of the following medications:  amprenavir  fosamprenavir This medicine may also interact with the following medications:  acitretin  aprepitant  armodafinil  bexarotene  bosentan  carbamazepine  certain medicines for fungal infections like fluconazole, ketoconazole, itraconazole and voriconazole  certain medicines to treat  hepatitis, HIV or AIDS  cyclosporine  felbamate  griseofulvin  lamotrigine  modafinil  oxcarbazepine  phenobarbital  phenytoin  primidone  rifabutin  rifampin  rifapentine  St. John's wort  topiramate This list may not describe all possible interactions. Give your health care provider a list of all the medicines, herbs, non-prescription drugs, or dietary supplements you use. Also tell them if you smoke, drink alcohol, or use illegal drugs. Some items may interact with your medicine. What should I watch for while using this medicine? This product does not protect you against HIV infection (AIDS) or other sexually transmitted diseases. You should be able to feel the implant by pressing your fingertips over the skin where it was inserted. Contact your doctor if you cannot feel the implant, and use a non-hormonal birth control method (such as condoms) until your doctor confirms that the implant is in place. Contact your doctor if you think that the implant may have broken or become bent while in your arm. You will receive a user card from your health care provider after the implant is inserted. The card is a record of the location of the implant in your upper arm and when it should be removed. Keep this card with your health records. What side effects may I notice from receiving this medicine? Side effects that you should report to your doctor or health care professional as soon as possible:  allergic reactions like skin rash, itching or hives, swelling of the face, lips, or tongue  breast lumps, breast tissue changes, or discharge  breathing problems  changes in emotions or moods  if you feel that the implant may have broken or   bent while in your arm  high blood pressure  pain, irritation, swelling, or bruising at the insertion site  scar at site of insertion  signs of infection at the insertion site such as fever, and skin redness, pain or discharge  signs and  symptoms of a blood clot such as breathing problems; changes in vision; chest pain; severe, sudden headache; pain, swelling, warmth in the leg; trouble speaking; sudden numbness or weakness of the face, arm or leg  signs and symptoms of liver injury like dark yellow or brown urine; general ill feeling or flu-like symptoms; light-colored stools; loss of appetite; nausea; right upper belly pain; unusually weak or tired; yellowing of the eyes or skin  unusual vaginal bleeding, discharge Side effects that usually do not require medical attention (report to your doctor or health care professional if they continue or are bothersome):  acne  breast pain or tenderness  headache  irregular menstrual bleeding  nausea This list may not describe all possible side effects. Call your doctor for medical advice about side effects. You may report side effects to FDA at 1-800-FDA-1088. Where should I keep my medicine? This drug is given in a hospital or clinic and will not be stored at home. NOTE: This sheet is a summary. It may not cover all possible information. If you have questions about this medicine, talk to your doctor, pharmacist, or health care provider.  2020 Elsevier/Gold Standard (2017-10-31 14:11:42)  

## 2019-10-14 NOTE — Progress Notes (Signed)
38 yo P4 here for sterilization consultation. Patient is currently using COC for contraception and is without complaints  Past Medical History:  Diagnosis Date  . Anxiety   . Gestational diabetes   . HPV in female   . Migraine   . Vaginal Pap smear, abnormal    Past Surgical History:  Procedure Laterality Date  . MOUTH SURGERY     Family History  Problem Relation Age of Onset  . Colon cancer Mother 33  . Heart disease Father   . Hypertension Father   . Stroke Father   . Diabetes Sister   . Asthma Brother   . Diabetes Paternal Grandmother   . Stroke Paternal Grandmother   . Clotting disorder Paternal Grandmother    Social History   Tobacco Use  . Smoking status: Former Smoker    Types: Cigarettes    Quit date: 10/2018    Years since quitting: 0.9  . Smokeless tobacco: Never Used  Substance Use Topics  . Alcohol use: No    Comment: before pregnancy  . Drug use: No    Comment: past marijuana   ROS See pertinent in HPI  Blood pressure 138/83, pulse 97, height 5\' 7"  (1.702 m), weight 245 lb (111.1 kg), last menstrual period 10/07/2019, not currently breastfeeding. GENERAL: Well-developed, well-nourished female in no acute distress.  EXTREMITIES: No cyanosis, clubbing, or edema, 2+ distal pulses. NEURO: alert and oriented x 3  A/P   38 y.o. [id number]  with undesired fertility, desires permanent sterilization. Other reversible forms of contraception were discussed with patient; she declines all other modalities.  Risks of procedure discussed with patient including permanence of method, bleeding, infection, injury to surrounding organs and need for additional procedures including laparotomy, risk of regret.  Failure risk of 0.5-1% with increased risk of ectopic gestation if pregnancy occurs was also discussed with patient.   Patient verbalized understanding and wishes to proceed. BTL form signed today

## 2019-10-14 NOTE — Progress Notes (Signed)
GYN presents for Tubal Consult.  BTL signed 10/14/2019

## 2019-11-18 ENCOUNTER — Other Ambulatory Visit: Payer: Self-pay | Admitting: Obstetrics and Gynecology

## 2019-11-26 ENCOUNTER — Encounter: Payer: Self-pay | Admitting: Obstetrics

## 2019-11-26 ENCOUNTER — Ambulatory Visit (HOSPITAL_BASED_OUTPATIENT_CLINIC_OR_DEPARTMENT_OTHER)
Admission: RE | Admit: 2019-11-26 | Payer: Medicaid Other | Source: Home / Self Care | Admitting: Obstetrics and Gynecology

## 2019-11-26 ENCOUNTER — Ambulatory Visit (INDEPENDENT_AMBULATORY_CARE_PROVIDER_SITE_OTHER): Payer: Medicaid Other | Admitting: Obstetrics and Gynecology

## 2019-11-26 ENCOUNTER — Other Ambulatory Visit: Payer: Self-pay

## 2019-11-26 ENCOUNTER — Encounter (HOSPITAL_BASED_OUTPATIENT_CLINIC_OR_DEPARTMENT_OTHER): Admission: RE | Payer: Self-pay | Source: Home / Self Care

## 2019-11-26 ENCOUNTER — Encounter: Payer: Self-pay | Admitting: Obstetrics and Gynecology

## 2019-11-26 VITALS — BP 133/87 | HR 89 | Ht 67.0 in | Wt 250.8 lb

## 2019-11-26 DIAGNOSIS — Z3202 Encounter for pregnancy test, result negative: Secondary | ICD-10-CM

## 2019-11-26 DIAGNOSIS — Z30017 Encounter for initial prescription of implantable subdermal contraceptive: Secondary | ICD-10-CM

## 2019-11-26 LAB — POCT URINE PREGNANCY: Preg Test, Ur: NEGATIVE

## 2019-11-26 SURGERY — LIGATION, FALLOPIAN TUBE, LAPAROSCOPIC
Anesthesia: Choice | Laterality: Bilateral

## 2019-11-26 MED ORDER — ETONOGESTREL 68 MG ~~LOC~~ IMPL
68.0000 mg | DRUG_IMPLANT | Freq: Once | SUBCUTANEOUS | Status: AC
  Administered 2019-11-26: 68 mg via SUBCUTANEOUS

## 2019-11-26 NOTE — Progress Notes (Signed)
Pt presents for Nexplanon insertion.  Takes BCP daily - admits to missing sometimes.  Unprotected IC 2 wks ago

## 2019-11-26 NOTE — Progress Notes (Signed)
GYNECOLOGY OFFICE PROCEDURE NOTE   Ms. Samantha Harris is a 38 y.o. [id number] here for Nexplanon insertion. She originally planned to have BTL, but changed her mind. No complaints. UPT ordered today.  BP 133/87   Pulse 89   Ht 5\' 7"  (1.702 m)   Wt 250 lb 12.8 oz (113.8 kg)   LMP 09/26/2019   BMI 39.28 kg/m    Results for orders placed or performed in visit on 11/26/19 (from the past 24 hour(s))  POCT urine pregnancy     Status: None   Collection Time: 11/26/19  4:04 PM  Result Value Ref Range   Preg Test, Ur Negative Negative      Nexplanon Insertion Procedure Patient identified, informed consent performed, consent signed.   Patient does understand that irregular bleeding is a very common side effect of this medication. She was advised to have backup contraception for one week after placement. Pregnancy test in clinic today was negative.  Appropriate time out taken.  Patient's left arm was prepped and draped in the usual sterile fashion. The ruler used to measure and mark insertion area.  Patient was prepped with alcohol swab and then injected with 3 ml of 1% lidocaine.  She was prepped with betadine, Nexplanon removed from packaging,  Device confirmed in needle, then inserted full length of needle and withdrawn per handbook instructions. Nexplanon was able to palpated in the patient's arm; patient palpated the insert herself. There was minimal blood loss.  Patient insertion site covered with guaze and a pressure bandage to reduce any bruising.  The patient tolerated the procedure well and was given post procedure instructions. Follow-up via My Chart video visit , unless having problems and need to be seen in the office.  Nexplanon Lot#: [id number] / Expiration Date: 11/22/2021  Laury Deep, CNM  11/26/2019 3:58 PM

## 2019-11-26 NOTE — Patient Instructions (Signed)

## 2019-12-24 ENCOUNTER — Telehealth: Payer: Medicaid Other | Admitting: Family Medicine

## 2020-01-16 ENCOUNTER — Telehealth: Payer: Self-pay | Admitting: Family Medicine

## 2020-03-24 ENCOUNTER — Other Ambulatory Visit: Payer: Self-pay

## 2020-03-24 MED ORDER — METRONIDAZOLE 0.75 % VA GEL: 1.0000 | Freq: Every day | VAGINAL | 0 refills | Status: DC

## 2020-03-24 NOTE — Progress Notes (Signed)
Metrogel rx per protocol for BV symptoms. Pt made aware.

## 2020-03-27 DIAGNOSIS — H5213 Myopia, bilateral: Secondary | ICD-10-CM | POA: Diagnosis not present

## 2020-05-11 ENCOUNTER — Encounter: Payer: Self-pay | Admitting: Obstetrics & Gynecology

## 2020-05-11 ENCOUNTER — Other Ambulatory Visit: Payer: Self-pay

## 2020-05-11 ENCOUNTER — Ambulatory Visit: Payer: Medicaid Other | Admitting: Obstetrics & Gynecology

## 2020-05-11 VITALS — BP 118/79 | HR 96 | Wt 246.8 lb

## 2020-05-11 DIAGNOSIS — Z3046 Encounter for surveillance of implantable subdermal contraceptive: Secondary | ICD-10-CM

## 2020-05-11 DIAGNOSIS — Z3009 Encounter for other general counseling and advice on contraception: Secondary | ICD-10-CM

## 2020-05-11 MED ORDER — MEDROXYPROGESTERONE ACETATE 10 MG PO TABS: 20.0000 mg | ORAL_TABLET | Freq: Every day | ORAL | 1 refills | Status: DC

## 2020-05-11 NOTE — Patient Instructions (Signed)
Etonogestrel implant What is this medicine? ETONOGESTREL (et oh noe JES trel) is a contraceptive (birth control) device. It is used to prevent pregnancy. It can be used for up to 3 years. This medicine may be used for other purposes; ask your health care provider or pharmacist if you have questions. COMMON BRAND NAME(S): Implanon, Nexplanon What should I tell my health care provider before I take this medicine? They need to know if you have any of these conditions:  abnormal vaginal bleeding  blood vessel disease or blood clots  breast, cervical, endometrial, ovarian, liver, or uterine cancer  diabetes  gallbladder disease  heart disease or recent heart attack  high blood pressure  high cholesterol or triglycerides  kidney disease  liver disease  migraine headaches  seizures  stroke  tobacco smoker  an unusual or allergic reaction to etonogestrel, anesthetics or antiseptics, other medicines, foods, dyes, or preservatives  pregnant or trying to get pregnant  breast-feeding How should I use this medicine? This device is inserted just under the skin on the inner side of your upper arm by a health care professional. Talk to your pediatrician regarding the use of this medicine in children. Special care may be needed. Overdosage: If you think you have taken too much of this medicine contact a poison control center or emergency room at once. NOTE: This medicine is only for you. Do not share this medicine with others. What if I miss a dose? This does not apply. What may interact with this medicine? Do not take this medicine with any of the following medications:  amprenavir  fosamprenavir This medicine may also interact with the following medications:  acitretin  aprepitant  armodafinil  bexarotene  bosentan  carbamazepine  certain medicines for fungal infections like fluconazole, ketoconazole, itraconazole and voriconazole  certain medicines to treat  hepatitis, HIV or AIDS  cyclosporine  felbamate  griseofulvin  lamotrigine  modafinil  oxcarbazepine  phenobarbital  phenytoin  primidone  rifabutin  rifampin  rifapentine  St. John's wort  topiramate This list may not describe all possible interactions. Give your health care provider a list of all the medicines, herbs, non-prescription drugs, or dietary supplements you use. Also tell them if you smoke, drink alcohol, or use illegal drugs. Some items may interact with your medicine. What should I watch for while using this medicine? This product does not protect you against HIV infection (AIDS) or other sexually transmitted diseases. You should be able to feel the implant by pressing your fingertips over the skin where it was inserted. Contact your doctor if you cannot feel the implant, and use a non-hormonal birth control method (such as condoms) until your doctor confirms that the implant is in place. Contact your doctor if you think that the implant may have broken or become bent while in your arm. You will receive a user card from your health care provider after the implant is inserted. The card is a record of the location of the implant in your upper arm and when it should be removed. Keep this card with your health records. What side effects may I notice from receiving this medicine? Side effects that you should report to your doctor or health care professional as soon as possible:  allergic reactions like skin rash, itching or hives, swelling of the face, lips, or tongue  breast lumps, breast tissue changes, or discharge  breathing problems  changes in emotions or moods  coughing up blood  if you feel that the implant   may have broken or bent while in your arm  high blood pressure  pain, irritation, swelling, or bruising at the insertion site  scar at site of insertion  signs of infection at the insertion site such as fever, and skin redness, pain or  discharge  signs and symptoms of a blood clot such as breathing problems; changes in vision; chest pain; severe, sudden headache; pain, swelling, warmth in the leg; trouble speaking; sudden numbness or weakness of the face, arm or leg  signs and symptoms of liver injury like dark yellow or brown urine; general ill feeling or flu-like symptoms; light-colored stools; loss of appetite; nausea; right upper belly pain; unusually weak or tired; yellowing of the eyes or skin  unusual vaginal bleeding, discharge Side effects that usually do not require medical attention (report to your doctor or health care professional if they continue or are bothersome):  acne  breast pain or tenderness  headache  irregular menstrual bleeding  nausea This list may not describe all possible side effects. Call your doctor for medical advice about side effects. You may report side effects to FDA at 1-800-FDA-1088. Where should I keep my medicine? This drug is given in a hospital or clinic and will not be stored at home. NOTE: This sheet is a summary. It may not cover all possible information. If you have questions about this medicine, talk to your doctor, pharmacist, or health care provider.  2020 Elsevier/Gold Standard (2019-09-24 11:33:04)  

## 2020-05-11 NOTE — Progress Notes (Signed)
Cc: spotting with Nexplanon 38 y.o. [id number] Patient's last menstrual period was 04/27/2020 (approximate).  Pt is here for nexplanon removal. Nexplanon inserted 11/26/2019. She has frequent spotting, has considered removal. I offered oral progestin to treat breakthrough bleeding without removing the device and she accepts this. Her questions were answered  Encounter for other general counseling or advice on contraception - Plan: medroxyPROGESTERone (PROVERA) 10 MG tablet  20 mg daily for 20 days  Adam Phenix, MD 05/11/2020

## 2020-08-06 ENCOUNTER — Emergency Department (HOSPITAL_COMMUNITY): Payer: Medicaid Other

## 2020-08-06 ENCOUNTER — Other Ambulatory Visit: Payer: Self-pay

## 2020-08-06 ENCOUNTER — Emergency Department (HOSPITAL_COMMUNITY)
Admission: EM | Admit: 2020-08-06 | Discharge: 2020-08-06 | Disposition: A | Discharge status: Home / Self Care | Payer: Medicaid Other | Attending: Emergency Medicine | Admitting: Emergency Medicine

## 2020-08-06 DIAGNOSIS — K8080 Other cholelithiasis without obstruction: Secondary | ICD-10-CM

## 2020-08-06 DIAGNOSIS — R1013 Epigastric pain: Secondary | ICD-10-CM | POA: Diagnosis not present

## 2020-08-06 DIAGNOSIS — R0789 Other chest pain: Secondary | ICD-10-CM | POA: Diagnosis not present

## 2020-08-06 DIAGNOSIS — Z87891 Personal history of nicotine dependence: Secondary | ICD-10-CM | POA: Diagnosis not present

## 2020-08-06 DIAGNOSIS — R079 Chest pain, unspecified: Secondary | ICD-10-CM | POA: Diagnosis not present

## 2020-08-06 DIAGNOSIS — K8020 Calculus of gallbladder without cholecystitis without obstruction: Secondary | ICD-10-CM | POA: Diagnosis not present

## 2020-08-06 DIAGNOSIS — Z9049 Acquired absence of other specified parts of digestive tract: Secondary | ICD-10-CM | POA: Insufficient documentation

## 2020-08-06 DIAGNOSIS — K7689 Other specified diseases of liver: Secondary | ICD-10-CM | POA: Diagnosis not present

## 2020-08-06 DIAGNOSIS — R160 Hepatomegaly, not elsewhere classified: Secondary | ICD-10-CM

## 2020-08-06 LAB — HEPATIC FUNCTION PANEL
ALT: 44 U/L (ref 0–44)
AST: 97 U/L — ABNORMAL HIGH (ref 15–41)
Albumin: 3.6 g/dL (ref 3.5–5.0)
Alkaline Phosphatase: 92 U/L (ref 38–126)
Bilirubin, Direct: <0.1 mg/dL (ref 0.0–0.2)
Total Bilirubin: 0.5 mg/dL (ref 0.3–1.2)
Total Protein: 6.8 g/dL (ref 6.5–8.1)

## 2020-08-06 LAB — BASIC METABOLIC PANEL
Anion gap: 9 (ref 5–15)
BUN: 8 mg/dL (ref 6–20)
CO2: 24 mmol/L (ref 22–32)
Calcium: 8.7 mg/dL — ABNORMAL LOW (ref 8.9–10.3)
Chloride: 104 mmol/L (ref 98–111)
Creatinine, Ser: 0.9 mg/dL (ref 0.44–1.00)
GFR calc Af Amer: >60 mL/min (ref >60)
GFR calc non Af Amer: >60 mL/min (ref >60)
Glucose, Bld: 160 mg/dL — ABNORMAL HIGH (ref 70–99)
Potassium: 3.7 mmol/L (ref 3.5–5.1)
Sodium: 137 mmol/L (ref 135–145)

## 2020-08-06 LAB — CBC
HCT: 37.5 % (ref 36.0–46.0)
Hemoglobin: 11 g/dL — ABNORMAL LOW (ref 12.0–15.0)
MCH: 22.1 pg — ABNORMAL LOW (ref 26.0–34.0)
MCHC: 29.3 g/dL — ABNORMAL LOW (ref 30.0–36.0)
MCV: 75.5 fL — ABNORMAL LOW (ref 80.0–100.0)
Platelets: 381 10*3/uL (ref 150–400)
RBC: 4.97 MIL/uL (ref 3.87–5.11)
RDW: 15 % (ref 11.5–15.5)
WBC: 8.6 10*3/uL (ref 4.0–10.5)
nRBC: 0 % (ref 0.0–0.2)

## 2020-08-06 LAB — TROPONIN I (HIGH SENSITIVITY)
Troponin I (High Sensitivity): <2 ng/L (ref <18)
Troponin I (High Sensitivity): 3 ng/L (ref <18)

## 2020-08-06 LAB — I-STAT BETA HCG BLOOD, ED (MC, WL, AP ONLY): I-stat hCG, quantitative: <5 m[IU]/mL (ref <5)

## 2020-08-06 LAB — LIPASE, BLOOD: Lipase: 26 U/L (ref 11–51)

## 2020-08-06 MED ORDER — HYDROCODONE-ACETAMINOPHEN 5-325 MG PO TABS: 1.0000 | ORAL_TABLET | ORAL | 0 refills | Status: DC | PRN

## 2020-08-06 MED ORDER — METOCLOPRAMIDE HCL 5 MG/ML IJ SOLN
5.0000 mg | Freq: Once | INTRAMUSCULAR | Status: AC
  Administered 2020-08-06: 5 mg via INTRAVENOUS
  Filled 2020-08-06: qty 2

## 2020-08-06 MED ORDER — MORPHINE SULFATE (PF) 4 MG/ML IV SOLN
4.0000 mg | Freq: Once | INTRAVENOUS | Status: AC
  Administered 2020-08-06: 4 mg via INTRAVENOUS
  Filled 2020-08-06: qty 1

## 2020-08-06 NOTE — ED Triage Notes (Signed)
Pt c/o CP (epigastric pain) that began about an hour ago. Pt thought is was heartburn but states that the pain radiated to her back. Pt was given 324 asa, 4mg  Zofran and 1 nitro by EMS.

## 2020-08-06 NOTE — Discharge Instructions (Signed)
You have two passes on the right side of your liver.  These will require evaluation by an MRI to exclude cancer.  Call the general surgeon to schedule an appointment about having her gallbladder taken out

## 2020-08-06 NOTE — ED Provider Notes (Signed)
[facility] EMERGENCY DEPARTMENT Provider Note   CSN: [id number] Arrival date & time: 08/06/20  0518     History Chief Complaint  Patient presents with  . Chest Pain    Samantha Harris is a 39 y.o. female.  39 year old female who presents with epigastric pain since 2:00 this morning.  States that she had a salad and pizza for dinner last night.  Developed GERD type symptoms.  No anginal quality to her symptoms.  EMS called and given Zofran along with nitroglycerin.  No real response to nitroglycerin.  Denies any fever or chills.  Pain is burning and radiates to the right upper quadrant.  No prior history of same.        Past Medical History:  Diagnosis Date  . Anxiety   . Gestational diabetes   . HPV in female   . Migraine   . Vaginal Pap smear, abnormal     Patient Active Problem List   Diagnosis Date Noted  . GDM, class A1 06/07/2019  . Obesity in pregnancy, antepartum 02/01/2019  . Supervision of high risk pregnancy, antepartum 01/29/2019  . Advanced maternal age in multigravida 10/30/2018  . Obesity (BMI 30-39.9) 06/12/2012  . Family history of colon cancer requiring screening colonoscopy 03/06/2012  . Hidradenitis axillaris 03/06/2012  . Panic disorder with agoraphobia and mild panic attacks 11/28/2011  . Tobacco abuse 11/28/2011    Past Surgical History:  Procedure Laterality Date  . MOUTH SURGERY       OB History    Gravida  6   Para  4   Term  4   Preterm      AB  2   Living  4     SAB      TAB  2   Ectopic      Multiple  0   Live Births  4           Family History  Problem Relation Age of Onset  . Colon cancer Mother 16  . Heart disease Father   . Hypertension Father   . Stroke Father   . Diabetes Sister   . Asthma Brother   . Diabetes Paternal Grandmother   . Stroke Paternal Grandmother   . Clotting disorder Paternal Grandmother     Social History   Tobacco Use  . Smoking status: Former Smoker      Types: Cigarettes    Quit date: 10/2018    Years since quitting: 1.7  . Smokeless tobacco: Never Used  Vaping Use  . Vaping Use: Never used  Substance Use Topics  . Alcohol use: No    Comment: before pregnancy  . Drug use: No    Comment: past marijuana    Home Medications Prior to Admission medications   Medication Sig Start Date End Date Taking? Authorizing Provider  acetaminophen (TYLENOL) 500 MG tablet Take 1,000 mg by mouth every 8 (eight) hours as needed for mild pain or headache.     [provider]  calcium carbonate (TUMS - DOSED IN MG ELEMENTAL CALCIUM) 500 MG chewable tablet Chew 2 tablets by mouth 2 (two) times daily as needed for indigestion or heartburn.    [provider]  ferrous sulfate 325 (65 FE) MG tablet Take 325 mg by mouth daily with breakfast.    [provider]  fluticasone (FLONASE) 50 MCG/ACT nasal spray Place 1 spray into both nostrils daily. Patient not taking: Reported on 05/11/2020 05/23/19   Hall-Potvin, [place], [place]  ibuprofen (ADVIL) 600 MG tablet Take 1 tablet (600 mg total) by mouth every 6 (six) hours. Patient not taking: Reported on 08/02/2019 06/09/19   Marny Lowenstein, PA-C  medroxyPROGESTERone (PROVERA) 10 MG tablet Take 2 tablets (20 mg total) by mouth daily. 05/11/20   Adam Phenix, MD  metroNIDAZOLE (METROGEL) 0.75 % vaginal gel Place 1 Applicatorful vaginally at bedtime. Apply one applicatorful to vagina at bedtime for 5 days Patient not taking: Reported on 05/11/2020 03/24/20   Brock Bad, MD  Norethindrone Acetate-Ethinyl Estrad-FE (LOESTRIN 24 FE) 1-20 MG-MCG(24) tablet Take 1 tablet by mouth daily. Patient not taking: Reported on 05/11/2020 08/02/19   Brock Bad, MD  Prenatal-DSS-FeCb-FeGl-FA [facility] BLOOM) 90-1 MG TABS Use as directed 1 tablet in the mouth or throat daily. Patient not taking: Reported on 11/26/2019 05/27/19   Brock Bad, MD  tinidazole [facility]) 500 MG tablet Take 2 tablets  (1,000 mg total) by mouth daily with breakfast. Patient not taking: Reported on 10/14/2019 08/08/19   Brock Bad, MD    Allergies    Patient has no known allergies.  Review of Systems   Review of Systems  All other systems reviewed and are negative.   Physical Exam Updated Vital Signs BP 121/79 (BP Location: Right Arm)   Pulse 82   Temp 98.6 F (37 C) (Oral)   Resp 16   Ht 1.702 m (5\' 7" )   Wt 112 kg   SpO2 100%   BMI 38.69 kg/m   Physical Exam Vitals and nursing note reviewed.  Constitutional:      General: She is not in acute distress.    Appearance: Normal appearance. She is well-developed. She is not toxic-appearing.  HENT:     Head: Normocephalic and atraumatic.  Eyes:     General: Lids are normal.     Conjunctiva/sclera: Conjunctivae normal.     Pupils: Pupils are equal, round, and reactive to light.  Neck:     Thyroid: No thyroid mass.     Trachea: No tracheal deviation.  Cardiovascular:     Rate and Rhythm: Normal rate and regular rhythm.     Heart sounds: Normal heart sounds. No murmur heard.  No gallop.   Pulmonary:     Effort: Pulmonary effort is normal. No respiratory distress.     Breath sounds: Normal breath sounds. No stridor. No decreased breath sounds, wheezing, rhonchi or rales.  Abdominal:     General: Bowel sounds are normal. There is no distension.     Palpations: Abdomen is soft.     Tenderness: There is abdominal tenderness in the right upper quadrant and epigastric area. There is no rebound.    Musculoskeletal:        General: No tenderness. Normal range of motion.     Cervical back: Normal range of motion and neck supple.  Skin:    General: Skin is warm and dry.     Findings: No abrasion or rash.  Neurological:     Mental Status: She is alert and oriented to person, place, and time.     GCS: GCS eye subscore is 4. GCS verbal subscore is 5. GCS motor subscore is 6.     Cranial Nerves: No cranial nerve deficit.     Sensory: No  sensory deficit.  Psychiatric:        Speech: Speech normal.        Behavior: Behavior normal.     ED Results / Procedures / Treatments  Labs (all labs ordered are listed, but only abnormal results are displayed) Labs Reviewed  BASIC METABOLIC PANEL - Abnormal; Notable for the following components:      Result Value   Glucose, Bld 160 (*)    Calcium 8.7 (*)    All other components within normal limits  CBC - Abnormal; Notable for the following components:   Hemoglobin 11.0 (*)    MCV 75.5 (*)    MCH 22.1 (*)    MCHC 29.3 (*)    All other components within normal limits  LIPASE, BLOOD  HEPATIC FUNCTION PANEL  I-STAT BETA HCG BLOOD, ED ([facility], [facility], [facility] ONLY)  TROPONIN I (HIGH SENSITIVITY)  TROPONIN I (HIGH SENSITIVITY)    EKG EKG Interpretation  Date/Time:  Thursday August 06 2020 05:20:20 EDT Ventricular Rate:  70 PR Interval:  156 QRS Duration: 76 QT Interval:  382 QTC Calculation: 412 R Axis:   82 Text Interpretation: Normal sinus rhythm with sinus arrhythmia Cannot rule out Anterior infarct , age undetermined Abnormal ECG Confirmed by Lorre Nick ([id number]) on 08/06/2020 8:30:11 AM   Radiology DG Chest 2 View  Result Date: 08/06/2020 CLINICAL DATA:  Chest pain this morning. EXAM: CHEST - 2 VIEW COMPARISON:  04/15/2011 FINDINGS: The cardiac silhouette, mediastinal and hilar contours are normal. The lungs are clear. No pleural effusion. No pulmonary lesions. The bony thorax is intact. IMPRESSION: No acute cardiopulmonary findings. Electronically Signed   By: Rudie Meyer M.D.   On: 08/06/2020 06:20    Procedures Procedures (including critical care time)  Medications Ordered in ED Medications  morphine 4 MG/ML injection 4 mg (has no administration in time range)  metoCLOPramide (REGLAN) injection 5 mg (has no administration in time range)    ED Course  I have reviewed the triage vital signs and the nursing notes.  Pertinent labs & imaging results that were  available during my care of the patient were reviewed by me and considered in my medical decision making (see chart for details).    MDM Rules/Calculators/A&P                          Patient with evidence of cholelithiasis on abdominal ultrasound.  Lipase and LFTs normal.  Does have abnormal lesions in her liver.  Spoke with patient about this and she will follow up for this.  We will also give referral to general surgery and short course of opiates Final Clinical Impression(s) / ED Diagnoses Final diagnoses:  None    Rx / DC Orders ED Discharge Orders    None       Lorre Nick, MD 08/06/20 1015

## 2020-08-07 ENCOUNTER — Telehealth: Payer: Self-pay | Admitting: *Deleted

## 2020-08-07 NOTE — Telephone Encounter (Signed)
Pt called back; Transition of care assessment completed:  Transition Care Management Follow-up Telephone Call  . Medicaid Managed Care Transition Call Status:MM [facility] Call Made  . Date of discharge and from where: [facility], 08/06/20  . How have you been since you were released from the hospital? "better"  . Any questions or concerns? No  Items Reviewed: Marland Kitchen Did the pt receive and understand the discharge instructions provided? Yes  . Medications obtained and verified? Yes  . Any new allergies since your discharge? No  . Dietary orders reviewed?  Yes . Do you have support at home?  Yes, family  Functional Questionnaire: (I = Independent and D = Dependent)  ADLs: Independent Bathing/Dressing:Independent Meal Prep: Independent Eating: Independent Maintaining continence: Independent Transferring/Ambulation: Independent Managing Meds: Independent   Follow up appointments reviewed:  PCP Hospital f/u appt confirmed? No, Patient would like to be assigned a PCP  Specialist Hospital f/u appt confirmed? Yes, patient to call [facility] on 08/07/20  Are transportation arrangements needed? No   If their condition worsens, is the pt aware to call PCP or go to the EmergencyDept.? yes  Was the patient provided with contact information for the PCP's office or ED? yes  Was to pt encouraged to call back with questions or concerns? yes  Burnard Bunting, RN, BSN, CCRN Patient Engagement Center [phone]

## 2020-08-07 NOTE — Telephone Encounter (Signed)
Medicaid Managed Care team Transition of Care Assessment outreach attempt #1 made today. Unable to reach patient. HIPPA compliant voice message left requesting a return call. The patient has also been enrolled in an automated discharge follow up call series and will receive two outreach attempts for transition of care assessment. Contact information has been left for the patient and the Medicaid Managed Care team is available to provide assistance to the patient at any time.  ° °Katrice Zariel Capano, RN, BSN, CCRN °Patient Engagement Center °[phone] ° °

## 2020-08-11 ENCOUNTER — Ambulatory Visit: Payer: Medicaid Other

## 2020-08-17 DIAGNOSIS — K8020 Calculus of gallbladder without cholecystitis without obstruction: Secondary | ICD-10-CM | POA: Diagnosis not present

## 2020-08-17 DIAGNOSIS — R160 Hepatomegaly, not elsewhere classified: Secondary | ICD-10-CM | POA: Diagnosis not present

## 2020-08-18 ENCOUNTER — Other Ambulatory Visit (HOSPITAL_COMMUNITY): Payer: Self-pay | Admitting: General Surgery

## 2020-08-18 ENCOUNTER — Other Ambulatory Visit: Payer: Self-pay | Admitting: General Surgery

## 2020-08-18 DIAGNOSIS — R160 Hepatomegaly, not elsewhere classified: Secondary | ICD-10-CM

## 2020-08-25 ENCOUNTER — Ambulatory Visit: Payer: Medicaid Other | Attending: Internal Medicine

## 2020-08-25 DIAGNOSIS — Z23 Encounter for immunization: Secondary | ICD-10-CM

## 2020-08-25 NOTE — Progress Notes (Signed)
   Covid-19 Vaccination Clinic  Name:  Samantha Harris    MRN: [id number] DOB: 12-20-1981  08/25/2020  Samantha Harris was observed post Covid-19 immunization for 15 minutes without incident. She was provided with Vaccine Information Sheet and instruction to access the V-Safe system.   Samantha Harris was instructed to call 911 with any severe reactions post vaccine: Marland Kitchen Difficulty breathing  . Swelling of face and throat  . A fast heartbeat  . A bad rash all over body  . Dizziness and weakness   Immunizations Administered    Name Date Dose VIS Date Harris   Pfizer COVID-19 Vaccine 08/25/2020 11:53 AM 0.3 mL 02/19/2019 Intramuscular   Manufacturer: [employer], [employer]   Lot: [id number]   NDC: [address]-0

## 2020-08-26 ENCOUNTER — Other Ambulatory Visit: Payer: Self-pay

## 2020-08-26 ENCOUNTER — Ambulatory Visit (HOSPITAL_COMMUNITY)
Admission: RE | Admit: 2020-08-26 | Discharge: 2020-08-26 | Disposition: A | Discharge status: Home / Self Care | Payer: Medicaid Other | Source: Ambulatory Visit | Attending: General Surgery | Admitting: General Surgery

## 2020-08-26 DIAGNOSIS — R160 Hepatomegaly, not elsewhere classified: Secondary | ICD-10-CM | POA: Diagnosis not present

## 2020-08-26 DIAGNOSIS — K8050 Calculus of bile duct without cholangitis or cholecystitis without obstruction: Secondary | ICD-10-CM | POA: Diagnosis not present

## 2020-08-26 MED ORDER — GADOBUTROL 1 MMOL/ML IV SOLN
10.0000 mL | Freq: Once | INTRAVENOUS | Status: AC | PRN
  Administered 2020-08-26: 10 mL via INTRAVENOUS

## 2020-09-01 MED FILL — PFIZER-BIONTECH COVID-19 VA: 30 | 1 days supply | Qty: 0 | Fill #0

## 2020-09-02 ENCOUNTER — Other Ambulatory Visit: Payer: Self-pay | Admitting: General Surgery

## 2020-09-04 ENCOUNTER — Other Ambulatory Visit: Payer: Self-pay

## 2020-09-04 ENCOUNTER — Other Ambulatory Visit: Payer: Medicaid Other

## 2020-09-04 DIAGNOSIS — Z20822 Contact with and (suspected) exposure to covid-19: Secondary | ICD-10-CM | POA: Diagnosis not present

## 2020-09-07 LAB — NOVEL CORONAVIRUS, NAA: SARS-CoV-2, NAA: DETECTED — AB

## 2020-09-08 ENCOUNTER — Encounter: Payer: Self-pay | Admitting: Nurse Practitioner

## 2020-09-08 ENCOUNTER — Telehealth (HOSPITAL_COMMUNITY): Payer: Self-pay | Admitting: Nurse Practitioner

## 2020-09-08 ENCOUNTER — Telehealth: Payer: Self-pay

## 2020-09-08 NOTE — Telephone Encounter (Signed)
Patient called nd she was informed that her test for COVID-19 was positive 09/04/20 and she can pass the germ to others.  She states she has cold symptoms cough chills but no fever. Symptom tiers and treatment plans for each read to patient.  Criteria for ending isolation were read to patient and quarantine for family members were reviewed. Good preventative practices were discussed. She verbalized understanding of all information. [place] [facility] will be notified.

## 2020-09-08 NOTE — Telephone Encounter (Signed)
Called to discuss with Samantha Harris about Covid symptoms and the use of regeneron, a monoclonal antibody infusion for those with mild to moderate Covid symptoms and at a high risk of hospitalization.     Pt is qualified for this infusion at the [facility] infusion center due to co-morbid conditions and/or a member of an at-risk group, however declines infusion at this time. Her mother had the infusion and got sicker. Patient concerned that she is sole caregiver for her children and would not be able to care for them if she was sicker.  Symptoms tier reviewed as well as criteria for ending isolation.  Symptoms reviewed that would warrant ED/Hospital evaluation. Preventative practices reviewed. Patient verbalized understanding. Patient advised to call back if he/she opts to proceed with infusion. Callback number provided. Urgent care and/or ER precautions given for severe symptoms.      Patient Active Problem List   Diagnosis Date Noted  . GDM, class A1 06/07/2019  . Obesity in pregnancy, antepartum 02/01/2019  . Supervision of high risk pregnancy, antepartum 01/29/2019  . Advanced maternal age in multigravida 10/30/2018  . Obesity (BMI 30-39.9) 06/12/2012  . Family history of colon cancer requiring screening colonoscopy 03/06/2012  . Hidradenitis axillaris 03/06/2012  . Panic disorder with agoraphobia and mild panic attacks 11/28/2011  . Tobacco abuse 11/28/2011     Consuello Masse, NP [facility]  [phone] [email or web address]

## 2020-09-15 ENCOUNTER — Ambulatory Visit: Payer: Medicaid Other

## 2020-09-18 ENCOUNTER — Other Ambulatory Visit: Payer: Self-pay

## 2020-09-18 ENCOUNTER — Emergency Department (HOSPITAL_COMMUNITY)
Admission: EM | Admit: 2020-09-18 | Discharge: 2020-09-19 | Disposition: A | Discharge status: Home / Self Care | Payer: Medicaid Other | Attending: Emergency Medicine | Admitting: Emergency Medicine

## 2020-09-18 DIAGNOSIS — R42 Dizziness and giddiness: Secondary | ICD-10-CM | POA: Diagnosis not present

## 2020-09-18 DIAGNOSIS — R109 Unspecified abdominal pain: Secondary | ICD-10-CM | POA: Diagnosis not present

## 2020-09-18 DIAGNOSIS — R1084 Generalized abdominal pain: Secondary | ICD-10-CM | POA: Diagnosis not present

## 2020-09-18 DIAGNOSIS — I959 Hypotension, unspecified: Secondary | ICD-10-CM | POA: Diagnosis not present

## 2020-09-18 DIAGNOSIS — R52 Pain, unspecified: Secondary | ICD-10-CM | POA: Diagnosis not present

## 2020-09-18 DIAGNOSIS — Z5321 Procedure and treatment not carried out due to patient leaving prior to being seen by health care provider: Secondary | ICD-10-CM | POA: Diagnosis not present

## 2020-09-18 DIAGNOSIS — R110 Nausea: Secondary | ICD-10-CM | POA: Diagnosis not present

## 2020-09-18 NOTE — ED Triage Notes (Signed)
Per pt she has a hx of gallbladder issues and then tonight she ate [employer] and her gallbladder has flared up. Pt is scheduled to have her gallbladder removed on October 18th. Pt said pain center of abdominal and radiates to her back.Some nausea, no vomiting. Was given of fentanyl.

## 2020-09-19 NOTE — ED Notes (Signed)
Pt did not want to wait and left. 

## 2020-09-21 ENCOUNTER — Other Ambulatory Visit: Payer: Self-pay

## 2020-09-21 ENCOUNTER — Other Ambulatory Visit: Payer: Medicaid Other

## 2020-09-21 DIAGNOSIS — Z114 Encounter for screening for human immunodeficiency virus [HIV]: Secondary | ICD-10-CM | POA: Diagnosis not present

## 2020-09-21 DIAGNOSIS — Z20822 Contact with and (suspected) exposure to covid-19: Secondary | ICD-10-CM | POA: Diagnosis not present

## 2020-09-21 DIAGNOSIS — E7800 Pure hypercholesterolemia, unspecified: Secondary | ICD-10-CM | POA: Diagnosis not present

## 2020-09-21 DIAGNOSIS — R0602 Shortness of breath: Secondary | ICD-10-CM | POA: Diagnosis not present

## 2020-09-21 DIAGNOSIS — Z7251 High risk heterosexual behavior: Secondary | ICD-10-CM | POA: Diagnosis not present

## 2020-09-21 DIAGNOSIS — Z1159 Encounter for screening for other viral diseases: Secondary | ICD-10-CM | POA: Diagnosis not present

## 2020-09-21 DIAGNOSIS — N926 Irregular menstruation, unspecified: Secondary | ICD-10-CM | POA: Diagnosis not present

## 2020-09-21 DIAGNOSIS — Z1339 Encounter for screening examination for other mental health and behavioral disorders: Secondary | ICD-10-CM | POA: Diagnosis not present

## 2020-09-21 DIAGNOSIS — R350 Frequency of micturition: Secondary | ICD-10-CM | POA: Diagnosis not present

## 2020-09-21 DIAGNOSIS — E559 Vitamin D deficiency, unspecified: Secondary | ICD-10-CM | POA: Diagnosis not present

## 2020-09-21 DIAGNOSIS — Z1331 Encounter for screening for depression: Secondary | ICD-10-CM | POA: Diagnosis not present

## 2020-09-21 DIAGNOSIS — R5383 Other fatigue: Secondary | ICD-10-CM | POA: Diagnosis not present

## 2020-09-21 DIAGNOSIS — Z131 Encounter for screening for diabetes mellitus: Secondary | ICD-10-CM | POA: Diagnosis not present

## 2020-09-21 DIAGNOSIS — Z0000 Encounter for general adult medical examination without abnormal findings: Secondary | ICD-10-CM | POA: Diagnosis not present

## 2020-09-22 ENCOUNTER — Ambulatory Visit: Payer: Medicaid Other | Attending: Internal Medicine

## 2020-09-22 DIAGNOSIS — Z23 Encounter for immunization: Secondary | ICD-10-CM

## 2020-09-22 LAB — NOVEL CORONAVIRUS, NAA: SARS-CoV-2, NAA: NOT DETECTED

## 2020-09-22 LAB — SARS-COV-2, NAA 2 DAY TAT

## 2020-09-22 NOTE — Progress Notes (Signed)
   Covid-19 Vaccination Clinic  Name:  Samantha Harris    MRN: [id number] DOB: [birth year]  09/22/2020  Samantha Harris was observed post Covid-19 immunization for 15 minutes without incident. She was provided with Vaccine Information Sheet and instruction to access the V-Safe system. Vaccinated By: Evorn Gong  Samantha Harris was instructed to call 911 with any severe reactions post vaccine: Marland Kitchen Difficulty breathing  . Swelling of face and throat  . A fast heartbeat  . A bad rash all over body  . Dizziness and weakness   Immunizations Administered    Name Date Dose VIS Date Route   Pfizer COVID-19 Vaccine 09/22/2020 11:05 AM 0.3 mL 02/19/2019 Intramuscular   Manufacturer: [employer], [employer]   Lot: [id number]   NDC: [id number]

## 2020-09-25 ENCOUNTER — Other Ambulatory Visit (HOSPITAL_BASED_OUTPATIENT_CLINIC_OR_DEPARTMENT_OTHER): Payer: Self-pay | Admitting: Internal Medicine

## 2020-09-25 MED FILL — PFIZER-BIONTECH COVID-19 VA: 30 | 1 days supply | Qty: 0 | Fill #0

## 2020-10-02 ENCOUNTER — Encounter (HOSPITAL_BASED_OUTPATIENT_CLINIC_OR_DEPARTMENT_OTHER): Payer: Self-pay | Admitting: General Surgery

## 2020-10-02 ENCOUNTER — Other Ambulatory Visit: Payer: Self-pay

## 2020-10-03 ENCOUNTER — Other Ambulatory Visit (HOSPITAL_COMMUNITY): Payer: Medicaid Other

## 2020-10-06 DIAGNOSIS — R7303 Prediabetes: Secondary | ICD-10-CM | POA: Diagnosis not present

## 2020-10-06 DIAGNOSIS — K8020 Calculus of gallbladder without cholecystitis without obstruction: Secondary | ICD-10-CM | POA: Diagnosis not present

## 2020-10-06 DIAGNOSIS — D509 Iron deficiency anemia, unspecified: Secondary | ICD-10-CM | POA: Diagnosis not present

## 2020-10-06 DIAGNOSIS — E559 Vitamin D deficiency, unspecified: Secondary | ICD-10-CM | POA: Diagnosis not present

## 2020-10-07 NOTE — Progress Notes (Signed)
Pt not tested for covid on Thurs. 10/08/20 due to pt testing + for covid on 09/04/20 (results in Epic). Pt is scheduled for surgery with Dr. Dwain Sarna on 10/12/20. Based on the guidelines the pt is in the 90 day window to not retest. The pt is still expected to quarantine until their procedure. Therefore, the pt can still have the scheduled procedure.   These are the guidelines as follows:  Guidance: Patient previously tested + COVID; now past 90 day window seeking elective surgery (asymptomatic)  Retest patient If negative, proceed with surgery If positive, postpone surgery for 10 days from positive test Patient to quarantine for the (10 days) Do not retest again prior to surgery (even if scheduled a couple of weeks out) Use standard precautions for surgery

## 2020-10-08 ENCOUNTER — Inpatient Hospital Stay (HOSPITAL_COMMUNITY)
Admission: RE | Admit: 2020-10-08 | Discharge: 2020-10-08 | Disposition: A | Discharge status: Home / Self Care | Payer: Medicaid Other | Source: Ambulatory Visit

## 2020-10-12 ENCOUNTER — Ambulatory Visit (HOSPITAL_BASED_OUTPATIENT_CLINIC_OR_DEPARTMENT_OTHER): Payer: Medicaid Other | Admitting: Certified Registered"

## 2020-10-12 ENCOUNTER — Ambulatory Visit (HOSPITAL_BASED_OUTPATIENT_CLINIC_OR_DEPARTMENT_OTHER)
Admission: RE | Admit: 2020-10-12 | Discharge: 2020-10-12 | Disposition: A | Discharge status: Home / Self Care | Payer: Medicaid Other | Attending: General Surgery | Admitting: General Surgery

## 2020-10-12 ENCOUNTER — Other Ambulatory Visit: Payer: Self-pay

## 2020-10-12 ENCOUNTER — Encounter (HOSPITAL_BASED_OUTPATIENT_CLINIC_OR_DEPARTMENT_OTHER): Payer: Self-pay | Admitting: General Surgery

## 2020-10-12 ENCOUNTER — Encounter (HOSPITAL_BASED_OUTPATIENT_CLINIC_OR_DEPARTMENT_OTHER)
Admission: RE | Disposition: A | Discharge status: Home / Self Care | Payer: Self-pay | Source: Home / Self Care | Attending: General Surgery

## 2020-10-12 DIAGNOSIS — K811 Chronic cholecystitis: Secondary | ICD-10-CM | POA: Diagnosis present

## 2020-10-12 DIAGNOSIS — K429 Umbilical hernia without obstruction or gangrene: Secondary | ICD-10-CM | POA: Diagnosis not present

## 2020-10-12 DIAGNOSIS — Z87891 Personal history of nicotine dependence: Secondary | ICD-10-CM | POA: Diagnosis not present

## 2020-10-12 DIAGNOSIS — K8010 Calculus of gallbladder with chronic cholecystitis without obstruction: Secondary | ICD-10-CM | POA: Insufficient documentation

## 2020-10-12 DIAGNOSIS — L732 Hidradenitis suppurativa: Secondary | ICD-10-CM | POA: Diagnosis not present

## 2020-10-12 DIAGNOSIS — G43909 Migraine, unspecified, not intractable, without status migrainosus: Secondary | ICD-10-CM | POA: Diagnosis not present

## 2020-10-12 DIAGNOSIS — G8918 Other acute postprocedural pain: Secondary | ICD-10-CM | POA: Diagnosis not present

## 2020-10-12 HISTORY — DX: Calculus of gallbladder without cholecystitis without obstruction: K80.20

## 2020-10-12 HISTORY — PX: CHOLECYSTECTOMY: SHX55

## 2020-10-12 LAB — POCT PREGNANCY, URINE: Preg Test, Ur: NEGATIVE

## 2020-10-12 SURGERY — LAPAROSCOPIC CHOLECYSTECTOMY
Anesthesia: General | Site: Abdomen

## 2020-10-12 MED ORDER — HYDROMORPHONE HCL 1 MG/ML IJ SOLN
INTRAMUSCULAR | Status: AC
  Filled 2020-10-12: qty 0.5

## 2020-10-12 MED ORDER — BUPIVACAINE-EPINEPHRINE 0.25% -1:200000 IJ SOLN
INTRAMUSCULAR | Status: DC | PRN
  Administered 2020-10-12: 13 mL

## 2020-10-12 MED ORDER — BUPIVACAINE HCL (PF) 0.25 % IJ SOLN
INTRAMUSCULAR | Status: DC | PRN
  Administered 2020-10-12: 60 mL

## 2020-10-12 MED ORDER — OXYCODONE HCL 5 MG PO TABS: 5.0000 mg | ORAL_TABLET | Freq: Four times a day (QID) | ORAL | 0 refills | Status: DC | PRN

## 2020-10-12 MED ORDER — ACETAMINOPHEN 500 MG PO TABS
ORAL_TABLET | ORAL | Status: AC
  Filled 2020-10-12: qty 2

## 2020-10-12 MED ORDER — FENTANYL CITRATE (PF) 100 MCG/2ML IJ SOLN
INTRAMUSCULAR | Status: AC
  Filled 2020-10-12: qty 2

## 2020-10-12 MED ORDER — KETOROLAC TROMETHAMINE 15 MG/ML IJ SOLN
INTRAMUSCULAR | Status: AC
  Filled 2020-10-12: qty 1

## 2020-10-12 MED ORDER — AMISULPRIDE (ANTIEMETIC) 5 MG/2ML IV SOLN
10.0000 mg | Freq: Once | INTRAVENOUS | Status: AC | PRN
  Administered 2020-10-12: 10 mg via INTRAVENOUS

## 2020-10-12 MED ORDER — DEXAMETHASONE SODIUM PHOSPHATE 10 MG/ML IJ SOLN
INTRAMUSCULAR | Status: AC
  Filled 2020-10-12: qty 1

## 2020-10-12 MED ORDER — DEXAMETHASONE SODIUM PHOSPHATE 4 MG/ML IJ SOLN
INTRAMUSCULAR | Status: DC | PRN
  Administered 2020-10-12: 10 mg via INTRAVENOUS

## 2020-10-12 MED ORDER — LIDOCAINE 2% (20 MG/ML) 5 ML SYRINGE
INTRAMUSCULAR | Status: AC
  Filled 2020-10-12: qty 5

## 2020-10-12 MED ORDER — CHLORHEXIDINE GLUCONATE CLOTH 2 % EX PADS: 6.0000 | MEDICATED_PAD | Freq: Once | CUTANEOUS | Status: DC

## 2020-10-12 MED ORDER — HYDROMORPHONE HCL 1 MG/ML IJ SOLN
0.2500 mg | INTRAMUSCULAR | Status: DC | PRN
  Administered 2020-10-12 (×2): 0.5 mg via INTRAVENOUS

## 2020-10-12 MED ORDER — ACETAMINOPHEN 500 MG PO TABS
1000.0000 mg | ORAL_TABLET | ORAL | Status: AC
  Administered 2020-10-12: 1000 mg via ORAL

## 2020-10-12 MED ORDER — ONDANSETRON HCL 4 MG/2ML IJ SOLN
INTRAMUSCULAR | Status: DC | PRN
  Administered 2020-10-12: 4 mg via INTRAVENOUS

## 2020-10-12 MED ORDER — LACTATED RINGERS IV SOLN: INTRAVENOUS | Status: DC

## 2020-10-12 MED ORDER — KETOROLAC TROMETHAMINE 15 MG/ML IJ SOLN
15.0000 mg | INTRAMUSCULAR | Status: AC
  Administered 2020-10-12: 15 mg via INTRAVENOUS

## 2020-10-12 MED ORDER — ENSURE PRE-SURGERY PO LIQD: 296.0000 mL | Freq: Once | ORAL | Status: DC

## 2020-10-12 MED ORDER — FENTANYL CITRATE (PF) 100 MCG/2ML IJ SOLN
100.0000 ug | Freq: Once | INTRAMUSCULAR | Status: AC
  Administered 2020-10-12: 100 ug via INTRAVENOUS

## 2020-10-12 MED ORDER — CEFAZOLIN SODIUM-DEXTROSE 2-4 GM/100ML-% IV SOLN
INTRAVENOUS | Status: AC
  Filled 2020-10-12: qty 100

## 2020-10-12 MED ORDER — AMISULPRIDE (ANTIEMETIC) 5 MG/2ML IV SOLN
INTRAVENOUS | Status: AC
  Filled 2020-10-12: qty 4

## 2020-10-12 MED ORDER — MIDAZOLAM HCL 2 MG/2ML IJ SOLN
INTRAMUSCULAR | Status: AC
  Filled 2020-10-12: qty 2

## 2020-10-12 MED ORDER — MEPERIDINE HCL 25 MG/ML IJ SOLN: 6.2500 mg | INTRAMUSCULAR | Status: DC | PRN

## 2020-10-12 MED ORDER — MIDAZOLAM HCL 2 MG/2ML IJ SOLN
INTRAMUSCULAR | Status: DC | PRN
  Administered 2020-10-12: 1 mg via INTRAVENOUS

## 2020-10-12 MED ORDER — MIDAZOLAM HCL 2 MG/2ML IJ SOLN
2.0000 mg | Freq: Once | INTRAMUSCULAR | Status: AC
  Administered 2020-10-12: 2 mg via INTRAVENOUS

## 2020-10-12 MED ORDER — LIDOCAINE HCL (CARDIAC) PF 100 MG/5ML IV SOSY
PREFILLED_SYRINGE | INTRAVENOUS | Status: DC | PRN
  Administered 2020-10-12: 50 mg via INTRAVENOUS

## 2020-10-12 MED ORDER — PROPOFOL 10 MG/ML IV BOLUS
INTRAVENOUS | Status: AC
  Filled 2020-10-12: qty 20

## 2020-10-12 MED ORDER — CEFAZOLIN SODIUM-DEXTROSE 2-4 GM/100ML-% IV SOLN
2.0000 g | INTRAVENOUS | Status: AC
  Administered 2020-10-12: 2 g via INTRAVENOUS

## 2020-10-12 MED ORDER — SUGAMMADEX SODIUM 200 MG/2ML IV SOLN
INTRAVENOUS | Status: DC | PRN
  Administered 2020-10-12: 200 mg via INTRAVENOUS

## 2020-10-12 MED ORDER — ROCURONIUM BROMIDE 100 MG/10ML IV SOLN
INTRAVENOUS | Status: DC | PRN
  Administered 2020-10-12: 100 mg via INTRAVENOUS

## 2020-10-12 MED ORDER — FENTANYL CITRATE (PF) 100 MCG/2ML IJ SOLN
INTRAMUSCULAR | Status: DC | PRN
Start: 2020-10-12 — End: 2020-10-12
  Administered 2020-10-12: 50 ug via INTRAVENOUS
  Administered 2020-10-12: 100 ug via INTRAVENOUS

## 2020-10-12 MED ORDER — OXYCODONE HCL 5 MG PO TABS
ORAL_TABLET | ORAL | Status: AC
  Filled 2020-10-12: qty 1

## 2020-10-12 MED ORDER — KETOROLAC TROMETHAMINE 30 MG/ML IJ SOLN
INTRAMUSCULAR | Status: AC
  Filled 2020-10-12: qty 1

## 2020-10-12 MED ORDER — PROPOFOL 10 MG/ML IV BOLUS
INTRAVENOUS | Status: DC | PRN
  Administered 2020-10-12: 50 mg via INTRAVENOUS
  Administered 2020-10-12: 150 mg via INTRAVENOUS

## 2020-10-12 MED ORDER — PROMETHAZINE HCL 25 MG/ML IJ SOLN: 6.2500 mg | INTRAMUSCULAR | Status: DC | PRN

## 2020-10-12 MED ORDER — HEMOSTATIC AGENTS (NO CHARGE) OPTIME
TOPICAL | Status: DC | PRN
  Administered 2020-10-12 (×2): 1 via TOPICAL

## 2020-10-12 MED ORDER — BUPIVACAINE HCL (PF) 0.25 % IJ SOLN
INTRAMUSCULAR | Status: AC
  Filled 2020-10-12: qty 30

## 2020-10-12 MED ORDER — ROCURONIUM BROMIDE 10 MG/ML (PF) SYRINGE
PREFILLED_SYRINGE | INTRAVENOUS | Status: AC
  Filled 2020-10-12: qty 10

## 2020-10-12 MED ORDER — ONDANSETRON HCL 4 MG/2ML IJ SOLN
INTRAMUSCULAR | Status: AC
  Filled 2020-10-12: qty 2

## 2020-10-12 MED ORDER — SODIUM CHLORIDE 0.9 % IR SOLN
Status: DC | PRN
  Administered 2020-10-12: 1000 mL

## 2020-10-12 MED ORDER — OXYCODONE HCL 5 MG/5ML PO SOLN: 5.0000 mg | Freq: Once | ORAL | Status: AC | PRN

## 2020-10-12 MED ORDER — GABAPENTIN 100 MG PO CAPS
100.0000 mg | ORAL_CAPSULE | ORAL | Status: AC
  Administered 2020-10-12: 100 mg via ORAL

## 2020-10-12 MED ORDER — GABAPENTIN 100 MG PO CAPS
ORAL_CAPSULE | ORAL | Status: AC
  Filled 2020-10-12: qty 1

## 2020-10-12 MED ORDER — OXYCODONE HCL 5 MG PO TABS
5.0000 mg | ORAL_TABLET | Freq: Once | ORAL | Status: AC | PRN
  Administered 2020-10-12: 5 mg via ORAL

## 2020-10-12 SURGICAL SUPPLY — 44 items
APPLIER CLIP 5 13 M/L LIGAMAX5 (MISCELLANEOUS) ×3
BLADE CLIPPER SURG (BLADE) IMPLANT
CANISTER SUCT 1200ML W/VALVE (MISCELLANEOUS) ×3 IMPLANT
CHLORAPREP W/TINT 26 (MISCELLANEOUS) ×3 IMPLANT
CLIP APPLIE 5 13 M/L LIGAMAX5 (MISCELLANEOUS) ×1 IMPLANT
CLOSURE WOUND 1/2 X4 (GAUZE/BANDAGES/DRESSINGS) ×1
COVER MAYO STAND STRL (DRAPES) ×3 IMPLANT
COVER WAND RF STERILE (DRAPES) IMPLANT
DECANTER SPIKE VIAL GLASS SM (MISCELLANEOUS) IMPLANT
DERMABOND ADVANCED (GAUZE/BANDAGES/DRESSINGS) ×2
DERMABOND ADVANCED .7 DNX12 (GAUZE/BANDAGES/DRESSINGS) ×1 IMPLANT
DEVICE TROCAR PUNCTURE CLOSURE (ENDOMECHANICALS) ×3 IMPLANT
DRAPE C-ARM 42X72 X-RAY (DRAPES) IMPLANT
DRAPE LAPAROSCOPIC ABDOMINAL (DRAPES) ×3 IMPLANT
ELECT REM PT RETURN 9FT ADLT (ELECTROSURGICAL) ×3
ELECTRODE REM PT RTRN 9FT ADLT (ELECTROSURGICAL) ×1 IMPLANT
GLOVE BIO SURGEON STRL SZ7 (GLOVE) ×3 IMPLANT
GLOVE BIOGEL PI IND STRL 7.5 (GLOVE) ×1 IMPLANT
GLOVE BIOGEL PI INDICATOR 7.5 (GLOVE) ×2
GOWN STRL REUS W/ TWL LRG LVL3 (GOWN DISPOSABLE) ×3 IMPLANT
GOWN STRL REUS W/TWL LRG LVL3 (GOWN DISPOSABLE) ×9
GRASPER SUT TROCAR 14GX15 (MISCELLANEOUS) IMPLANT
HEMOSTAT SNOW SURGICEL 2X4 (HEMOSTASIS) ×6 IMPLANT
NS IRRIG 1000ML POUR BTL (IV SOLUTION) ×3 IMPLANT
PACK BASIN DAY SURGERY FS (CUSTOM PROCEDURE TRAY) ×3 IMPLANT
POUCH RETRIEVAL ECOSAC 10 (ENDOMECHANICALS) ×1 IMPLANT
POUCH RETRIEVAL ECOSAC 10MM (ENDOMECHANICALS) ×3
SCISSORS LAP 5X35 DISP (ENDOMECHANICALS) ×3 IMPLANT
SET CHOLANGIOGRAPH 5 50 .035 (SET/KITS/TRAYS/PACK) IMPLANT
SET IRRIG TUBING LAPAROSCOPIC (IRRIGATION / IRRIGATOR) ×3 IMPLANT
SET TUBE SMOKE EVAC HIGH FLOW (TUBING) ×3 IMPLANT
SLEEVE ENDOPATH XCEL 5M (ENDOMECHANICALS) ×6 IMPLANT
SLEEVE SCD COMPRESS KNEE MED (MISCELLANEOUS) ×3 IMPLANT
STRIP CLOSURE SKIN 1/2X4 (GAUZE/BANDAGES/DRESSINGS) ×2 IMPLANT
SUT ETHIBOND NAB CT1 #1 30IN (SUTURE) ×3 IMPLANT
SUT MNCRL AB 4-0 PS2 18 (SUTURE) ×3 IMPLANT
SUT VIC AB 3-0 FS2 27 (SUTURE) ×3 IMPLANT
SUT VICRYL 0 UR6 27IN ABS (SUTURE) IMPLANT
TOWEL GREEN STERILE FF (TOWEL DISPOSABLE) ×6 IMPLANT
TRAY LAPAROSCOPIC (CUSTOM PROCEDURE TRAY) ×3 IMPLANT
TROCAR XCEL BLUNT TIP 100MML (ENDOMECHANICALS) ×3 IMPLANT
TROCAR XCEL NON-BLD 5MMX100MML (ENDOMECHANICALS) ×3 IMPLANT
TUBE CONNECTING 20'X1/4 (TUBING) ×1
TUBE CONNECTING 20X1/4 (TUBING) ×2 IMPLANT

## 2020-10-12 NOTE — Op Note (Signed)
Preoperative diagnosis: chronic cholecystitis Postoperative diagnosis: saa Procedure: Laparoscopic cholecystectomy Primary umbilical hernia repair Surgeon: Dr Harden Mo Anesthesia: general with tap blocks EBL: 50 cc Complications none Drains none Sponge and needle count correct dispo to recovery stable  Indications: 11 yof otherwise healthy who had first episode epigastric/ruq pain radiating to back on 8/12. she went to er. mild elevated AST. had [place] that shows cholelithiasis, cbd 27mm and two hyperechoic lliver masses. she has not had any more symptoms associated with this. had n/v with first one. She had follow up liver mri that shows multiple hemangiomas.  I discussed lap cholecystectomy with her.  Procedure: After informed consent was obtained she first underwent bilateral tap blocks.  She was given antibiotics.  SCDs were in place.  She was placed under general anesthesia without complication.  She was prepped and draped in the standard sterile surgical fashion.  A surgical timeout was then performed.  I infiltrated Marcaine below the umbilicus.  I made a curvilinear incision I divided the umbilical stalk and she did have a small 6 mm umbilical hernia.  I entered into the peritoneal cavity and then I placed a 0 Vicryl pursestring sutures through the hernia.  I inserted my Hassan trocar through the hernia defect.  I then insufflated the abdomen to 15 mmHg pressure.  I then inserted 3 additional 5 mm trochars in the epigastrium and right side of the abdomen.  The gallbladder was then retracted cephalad and lateral.  She had evidence of chronic cholecystitis and she had omentum and stomach that were scarred to the gallbladder.  These were taken down with blunt dissection.  I was then able to dissect in the triangle and clearly viewed both the cystic duct and the cystic artery and obtain the critical view of safety.  I remove the gallbladder from the lower third of the cystic plate.  I then  clipped the artery 3 times dividing it leaving 2 clips in place.  I then treated the cystic duct in a similar fashion.  The duct was viable and the clips completely cross the duct.  I then remove the gallbladder from the liver bed.  She had a very fatty liver.  The gallbladder was fused to the liver bed so I did enter into the gallbladder in 1 position and spilled a small amount of bile.  The liver was also very friable.  Eventually I was able to remove the gallbladder and placed in a retrieval bag.  I removed it from the umbilical port.  I then cauterized the entire liver bed.  There was still some oozing from several sites and I placed 2 pieces of Surgicel snow.  I held pressure on these and watched him for several minutes.  It appeared that they were hemostatic upon completion.  I evacuated all the fluid.  I then removed my umbilical trocar and tied my pursestring down.  This completely obliterated the defect.  I did fix the umbilical hernia with 3 additional #1 Ethibond sutures.  I then desufflated the abdomen and remove the remaining trochars.  I then closed the umbilical incision with a 3-0 Vicryl to tack the umbilicus back down and then a 4-0 Monocryl.  Glue was placed.  I closed the others with 4-0 Monocryl and glue.  She tolerated this well was extubated and transferred to recovery stable.

## 2020-10-12 NOTE — H&P (Signed)
Samantha Harris is an 39 y.o. female.   Chief Complaint: ruq pain HPI: 69 yof otherwise healthy who had first episode epigastric/ruq pain radiating to back on 8/12. she went to er. mild elevated AST. had [place] that shows cholelithiasis, cbd 87mm and two hyperechoic lliver masses. she has not had any more symptoms associated with this. had n/v with first one. She had follow up liver mri that shows multiple hemangiomas.     Past Medical History:  Diagnosis Date  . Anxiety   . Gallstones   . HPV in female   . Migraine   . Vaginal Pap smear, abnormal     Past Surgical History:  Procedure Laterality Date  . MOUTH SURGERY      Family History  Problem Relation Age of Onset  . Colon cancer Mother 33  . Heart disease Father   . Hypertension Father   . Stroke Father   . Diabetes Sister   . Asthma Brother   . Diabetes Paternal Grandmother   . Stroke Paternal Grandmother   . Clotting disorder Paternal Grandmother    Social History:  reports that she quit smoking about 1 years ago. Her smoking use included cigarettes. She has never used smokeless tobacco. She reports that she does not drink alcohol and does not use drugs.  Allergies: No Known Allergies  Medications Prior to Admission  Medication Sig Dispense Refill  . cholecalciferol (VITAMIN D3) 25 MCG (1000 UNIT) tablet Take 1,000 Units by mouth daily.    Marland Kitchen HYDROcodone-acetaminophen (NORCO/VICODIN) 5-325 MG tablet Take 1-2 tablets by mouth every 4 (four) hours as needed. 10 tablet 0  . ibuprofen (ADVIL) 200 MG tablet Take 800 mg by mouth every 6 (six) hours as needed for headache or mild pain.      Results for orders placed or performed during the hospital encounter of 10/12/20 (from the past 48 hour(s))  Pregnancy, urine POC     Status: None   Collection Time: 10/12/20  6:53 AM  Result Value Ref Range   Preg Test, Ur NEGATIVE NEGATIVE    Comment:        THE SENSITIVITY OF THIS METHODOLOGY IS >24 mIU/mL    No results  found.  Review of Systems  Gastrointestinal: Positive for abdominal pain.  All other systems reviewed and are negative.   Blood pressure 121/77, pulse 66, temperature 98.8 F (37.1 C), temperature source Oral, resp. rate 16, height 5\' 7"  (1.702 m), weight 106.1 kg, SpO2 100 %, not currently breastfeeding. Physical Exam  Physical Exam  General Mental Status-Alert. Orientation-Oriented X3. cv rrr Lungs clear Abdomen Note: soft nt/nd small < 1 cm uh  Assessment/Plan GALLSTONES (K80.20)  Laparoscopic cholecystectomy I discussed the procedure in detail. We discussed the risks and benefits of a laparoscopic cholecystectomy and possible cholangiogram including, but not limited to bleeding, infection, injury to surrounding structures such as the intestine or liver, bile leak, retained gallstones, need to convert to an open procedure, prolonged diarrhea, blood clots such as DVT, common bile duct injury, anesthesia risks, and possible need for additional procedures. The likelihood of improvement in symptoms and return to the patient's normal status is good. We discussed the typical post-operative recovery course.  , MD 10/12/2020, 7:18 AM

## 2020-10-12 NOTE — Transfer of Care (Signed)
Immediate Anesthesia Transfer of Care Note  Patient: Samantha Harris  Procedure(s) Performed: LAPAROSCOPIC CHOLECYSTECTOMY WITH PRIMARY UMBILICAL CLOSURE (N/A Abdomen)  Patient Location: PACU  Anesthesia Type:General  Level of Consciousness: awake, alert  and oriented  Airway & Oxygen Therapy: Patient Spontanous Breathing and Patient connected to nasal cannula oxygen  Post-op Assessment: Report given to RN and Post -op Vital signs reviewed and stable  Post vital signs: Reviewed and stable  Last Vitals:  Vitals Value Taken Time  BP    Temp    Pulse 84 10/12/20 0902  Resp 16 10/12/20 0902  SpO2 100 % 10/12/20 0902  Vitals shown include unvalidated device data.  Last Pain:  Vitals:   10/12/20 0708  TempSrc: Oral  PainSc: 0-No pain         Complications: No complications documented.

## 2020-10-12 NOTE — Anesthesia Postprocedure Evaluation (Signed)
Anesthesia Post Note  Patient: Samantha Harris  Procedure(s) Performed: LAPAROSCOPIC CHOLECYSTECTOMY WITH PRIMARY UMBILICAL CLOSURE (N/A Abdomen)     Patient location during evaluation: PACU Anesthesia Type: General Level of consciousness: awake and alert Pain management: pain level controlled Vital Signs Assessment: post-procedure vital signs reviewed and stable Respiratory status: spontaneous breathing, nonlabored ventilation and respiratory function stable Cardiovascular status: blood pressure returned to baseline and stable Postop Assessment: no apparent nausea or vomiting Anesthetic complications: no   No complications documented.  Last Vitals:  Vitals:   10/12/20 0945 10/12/20 1000  BP: 117/63 122/67  Pulse: 65 71  Resp: (!) 9 (!) 21  Temp:    SpO2: 100% 99%    Last Pain:  Vitals:   10/12/20 1027  TempSrc:   PainSc: 5                  Lowella Curb

## 2020-10-12 NOTE — Discharge Instructions (Signed)
No Tylenol or Ibuprofen until 1:30 today if needed.                                 [facility] LAPAROSCOPIC SURGERY: POST OP INSTRUCTIONS  Always review your discharge instruction sheet given to you by the facility where your surgery was performed. IF YOU HAVE DISABILITY OR FAMILY LEAVE FORMS, YOU MUST BRING THEM TO THE OFFICE FOR PROCESSING.   DO NOT GIVE THEM TO YOUR DOCTOR.  1. A prescription for pain medication may be given to you upon discharge.  Take your pain medication as prescribed, if needed.  If narcotic pain medicine is not needed, then you may take acetaminophen (Tylenol), naprosyn (Alleve), or ibuprofen (Advil) as needed. 2. Take your usually prescribed medications unless otherwise directed. 3. If you need a refill on your pain medication, please contact your pharmacy.  They will contact our office to request authorization. Prescriptions will not be filled after 5pm or on week-ends. 4. You should follow a light diet the first few days after arrival home, such as soup and crackers, etc.  Be sure to include lots of fluids daily. 5. Most patients will experience some swelling and bruising in the area of the incisions.  Ice packs will help.  Swelling and bruising can take several days to resolve.  6. It is common to experience some constipation if taking pain medication after surgery.  Increasing fluid intake and taking a stool softener (such as Colace) will usually help or prevent this problem from occurring.  A mild laxative (Milk of Magnesia or Miralax) should be taken according to package instructions if there are no bowel movements after 48 hours. 7. Unless discharge instructions indicate otherwise, you may remove your bandages 48 hours after surgery, and you may shower at that time.  You may have steri-strips (small skin tapes) in place directly over the incision.  These strips should be left on the skin for 7-10 days.  If your surgeon used skin glue on the  incision, you may shower in 24 hours.  The glue will flake off over the next 2-3 weeks.  Any sutures or staples will be removed at the office during your follow-up visit. 8. ACTIVITIES:  You may resume regular (light) daily activities beginning the next day--such as daily self-care, walking, climbing stairs--gradually increasing activities as tolerated.  You may have sexual intercourse when it is comfortable.  Refrain from any heavy lifting or straining until approved by your doctor. a. You may drive when you are no longer taking prescription pain medication, you can comfortably wear a seatbelt, and you can safely maneuver your car and apply brakes. b. RETURN TO WORK:  __________________________________________________________ 9. You should see your doctor in the office for a follow-up appointment approximately 2-3 weeks after your surgery.  Make sure that you call for this appointment within a day or two after you arrive home to insure a convenient appointment time. 10. OTHER INSTRUCTIONS: __________________________________________________________________________________________________________________________ __________________________________________________________________________________________________________________________ WHEN TO CALL YOUR DOCTOR: 1. Fever over 101.0 2. Inability to urinate 3. Continued bleeding from incision. 4. Increased pain, redness, or drainage from the incision. 5. Increasing abdominal pain  The clinic staff is available to answer your questions during regular business hours.  Please don't hesitate to call and ask to speak to one of the nurses for clinical concerns.  If you have a medical emergency, go to the nearest emergency room or call  911.  A surgeon from [facility] is always on call at the hospital. [address], [address], [place], [place]  [address] ? [address] [address], [place], [place]   [address] [phone] ? [phone] ? FAX [phone]  [phone] Web site: [email or web address]   Post Anesthesia Home Care Instructions  Activity: Get plenty of rest for the remainder of the day. A responsible individual must stay with you for 24 hours following the procedure.  For the next 24 hours, DO NOT: -Drive a car -Advertising copywriter -Drink alcoholic beverages -Take any medication unless instructed by your physician -Make any legal decisions or sign important papers.  Meals: Start with liquid foods such as gelatin or soup. Progress to regular foods as tolerated. Avoid greasy, spicy, heavy foods. If nausea and/or vomiting occur, drink only clear liquids until the nausea and/or vomiting subsides. Call your physician if vomiting continues.  Special Instructions/Symptoms: Your throat may feel dry or sore from the anesthesia or the breathing tube placed in your throat during surgery. If this causes discomfort, gargle with warm salt water. The discomfort should disappear within 24 hours.  If you had a scopolamine patch placed behind your ear for the management of post- operative nausea and/or vomiting:  1. The medication in the patch is effective for 72 hours, after which it should be removed.  Wrap patch in a tissue and discard in the trash. Wash hands thoroughly with soap and water. 2. You may remove the patch earlier than 72 hours if you experience unpleasant side effects which may include dry mouth, dizziness or visual disturbances. 3. Avoid touching the patch. Wash your hands with soap and water after contact with the patch.

## 2020-10-12 NOTE — Interval H&P Note (Signed)
History and Physical Interval Note:  10/12/2020 7:20 AM  Samantha Harris  has presented today for surgery, with the diagnosis of GALLSTONES.  The various methods of treatment have been discussed with the patient and family. After consideration of risks, benefits and other options for treatment, the patient has consented to  Procedure(s): LAPAROSCOPIC CHOLECYSTECTOMY (N/A) as a surgical intervention.  The patient's history has been reviewed, patient examined, no change in status, stable for surgery.  I have reviewed the patient's chart and labs.  Questions were answered to the patient's satisfaction.     Emelia Loron

## 2020-10-12 NOTE — Anesthesia Procedure Notes (Signed)
Procedure Name: Intubation Date/Time: 10/12/2020 7:53 AM Performed by: Cleda Clarks, CRNA Pre-anesthesia Checklist: Patient identified, Emergency Drugs available, Suction available and Patient being monitored Patient Re-evaluated:Patient Re-evaluated prior to induction Oxygen Delivery Method: Circle system utilized Preoxygenation: Pre-oxygenation with 100% oxygen Induction Type: IV induction Ventilation: Mask ventilation without difficulty Laryngoscope Size: Miller and 2 Grade View: Grade I Tube type: Oral Tube size: 7.0 mm Number of attempts: 1 Airway Equipment and Method: Stylet and Oral airway Placement Confirmation: ETT inserted through vocal cords under direct vision,  positive ETCO2 and breath sounds checked- equal and bilateral Secured at: 21 cm Tube secured with: Tape Dental Injury: Teeth and Oropharynx as per pre-operative assessment

## 2020-10-12 NOTE — Anesthesia Procedure Notes (Signed)
Anesthesia Regional Block: TAP block   Pre-Anesthetic Checklist: ,, timeout performed, Correct Patient, Correct Site, Correct Laterality, Correct Procedure, Correct Position, site marked, Risks and benefits discussed,  Surgical consent,  Pre-op evaluation,  At surgeon's request and post-op pain management  Laterality: Left and Right  Prep: chloraprep       Needles:  Injection technique: Single-shot  Needle Type: Stimiplex     Needle Length: 9cm  Needle Gauge: 21     Additional Needles:   Procedures:,,,, ultrasound used (permanent image in chart),,,,  Narrative:  Start time: 10/12/2020 7:44 AM End time: 10/12/2020 7:49 AM Injection made incrementally with aspirations every 5 mL.  Performed by: Personally  Anesthesiologist: Lowella Curb, MD

## 2020-10-12 NOTE — Anesthesia Preprocedure Evaluation (Signed)
Anesthesia Evaluation  Patient identified by MRN, date of birth, ID band Patient awake    Reviewed: Allergy & Precautions, NPO status , Patient's Chart, lab work & pertinent test results  History of Anesthesia Complications Negative for: history of anesthetic complications  Airway Mallampati: II  TM Distance: >3 FB Neck ROM: Full    Dental no notable dental hx.    Pulmonary neg pulmonary ROS, former smoker,    Pulmonary exam normal breath sounds clear to auscultation       Cardiovascular negative cardio ROS Normal cardiovascular exam Rhythm:Regular Rate:Normal     Neuro/Psych  Headaches, Anxiety negative neurological ROS  negative psych ROS   GI/Hepatic negative GI ROS, Neg liver ROS,   Endo/Other  negative endocrine ROSObese, BMI 38  Renal/GU negative Renal ROS  negative genitourinary   Musculoskeletal negative musculoskeletal ROS (+)   Abdominal (+) + obese,   Peds negative pediatric ROS (+)  Hematology negative hematology ROS (+)   Anesthesia Other Findings Day of surgery medications reviewed with the patient.  Reproductive/Obstetrics negative OB ROS                             Anesthesia Physical  Anesthesia Plan  ASA: II  Anesthesia Plan: General   Post-op Pain Management:  Regional for Post-op pain   Induction: Intravenous  PONV Risk Score and Plan: 3 and Treatment may vary due to age or medical condition, Ondansetron, Dexamethasone and Midazolam  Airway Management Planned: Oral ETT  Additional Equipment:   Intra-op Plan:   Post-operative Plan: Extubation in OR  Informed Consent: I have reviewed the patients History and Physical, chart, labs and discussed the procedure including the risks, benefits and alternatives for the proposed anesthesia with the patient or authorized representative who has indicated his/her understanding and acceptance.     Dental advisory  given  Plan Discussed with: CRNA  Anesthesia Plan Comments:         Anesthesia Quick Evaluation

## 2020-10-12 NOTE — Progress Notes (Signed)
Assisted Dr. Miller with right, left, ultrasound guided, transabdominal plane block. Side rails up, monitors on throughout procedure. See vital signs in flow sheet. Tolerated Procedure well. 

## 2020-10-13 ENCOUNTER — Encounter (HOSPITAL_BASED_OUTPATIENT_CLINIC_OR_DEPARTMENT_OTHER): Payer: Self-pay | Admitting: General Surgery

## 2020-10-13 LAB — SURGICAL PATHOLOGY

## 2020-10-13 NOTE — Addendum Note (Signed)
Addendum  created 10/13/20 0829 by Burna Cash, CRNA   Charge Capture section accepted

## 2020-10-21 DIAGNOSIS — Z800 Family history of malignant neoplasm of digestive organs: Secondary | ICD-10-CM | POA: Diagnosis not present

## 2020-10-21 DIAGNOSIS — Z01818 Encounter for other preprocedural examination: Secondary | ICD-10-CM | POA: Diagnosis not present

## 2020-10-21 DIAGNOSIS — Z1211 Encounter for screening for malignant neoplasm of colon: Secondary | ICD-10-CM | POA: Diagnosis not present

## 2020-11-23 DIAGNOSIS — Z1211 Encounter for screening for malignant neoplasm of colon: Secondary | ICD-10-CM | POA: Diagnosis not present

## 2020-11-23 DIAGNOSIS — Z01818 Encounter for other preprocedural examination: Secondary | ICD-10-CM | POA: Diagnosis not present

## 2020-12-01 DIAGNOSIS — K635 Polyp of colon: Secondary | ICD-10-CM | POA: Diagnosis not present

## 2021-01-04 DIAGNOSIS — E559 Vitamin D deficiency, unspecified: Secondary | ICD-10-CM | POA: Diagnosis not present

## 2021-01-04 DIAGNOSIS — D509 Iron deficiency anemia, unspecified: Secondary | ICD-10-CM | POA: Diagnosis not present

## 2021-01-04 DIAGNOSIS — Z79899 Other long term (current) drug therapy: Secondary | ICD-10-CM | POA: Diagnosis not present

## 2021-01-04 DIAGNOSIS — R7303 Prediabetes: Secondary | ICD-10-CM | POA: Diagnosis not present

## 2021-01-04 DIAGNOSIS — Z1159 Encounter for screening for other viral diseases: Secondary | ICD-10-CM | POA: Diagnosis not present

## 2021-01-06 ENCOUNTER — Other Ambulatory Visit: Payer: Self-pay | Admitting: Obstetrics

## 2021-04-15 DIAGNOSIS — Z6837 Body mass index (BMI) 37.0-37.9, adult: Secondary | ICD-10-CM | POA: Diagnosis not present

## 2021-04-15 DIAGNOSIS — E611 Iron deficiency: Secondary | ICD-10-CM | POA: Diagnosis not present

## 2021-04-15 DIAGNOSIS — E559 Vitamin D deficiency, unspecified: Secondary | ICD-10-CM | POA: Diagnosis not present

## 2021-04-15 DIAGNOSIS — Z87898 Personal history of other specified conditions: Secondary | ICD-10-CM | POA: Diagnosis not present

## 2021-04-15 DIAGNOSIS — R5383 Other fatigue: Secondary | ICD-10-CM | POA: Diagnosis not present

## 2021-05-31 DIAGNOSIS — R7303 Prediabetes: Secondary | ICD-10-CM | POA: Diagnosis not present

## 2021-05-31 DIAGNOSIS — Z6837 Body mass index (BMI) 37.0-37.9, adult: Secondary | ICD-10-CM | POA: Diagnosis not present

## 2021-05-31 DIAGNOSIS — D509 Iron deficiency anemia, unspecified: Secondary | ICD-10-CM | POA: Diagnosis not present

## 2021-07-27 ENCOUNTER — Ambulatory Visit (HOSPITAL_COMMUNITY)
Admission: EM | Admit: 2021-07-27 | Discharge: 2021-07-27 | Disposition: A | Discharge status: Home / Self Care | Payer: Medicaid Other | Attending: Emergency Medicine | Admitting: Emergency Medicine

## 2021-07-27 ENCOUNTER — Encounter (HOSPITAL_COMMUNITY): Payer: Self-pay | Admitting: Emergency Medicine

## 2021-07-27 DIAGNOSIS — L02419 Cutaneous abscess of limb, unspecified: Secondary | ICD-10-CM | POA: Diagnosis not present

## 2021-07-27 DIAGNOSIS — Z20822 Contact with and (suspected) exposure to covid-19: Secondary | ICD-10-CM | POA: Diagnosis not present

## 2021-07-27 DIAGNOSIS — J069 Acute upper respiratory infection, unspecified: Secondary | ICD-10-CM | POA: Diagnosis not present

## 2021-07-27 LAB — POC URINE PREG, ED: Preg Test, Ur: NEGATIVE

## 2021-07-27 MED ORDER — LIDOCAINE HCL (PF) 1 % IJ SOLN
INTRAMUSCULAR | Status: AC
  Filled 2021-07-27: qty 2

## 2021-07-27 MED ORDER — BENZONATATE 200 MG PO CAPS: 200.0000 mg | ORAL_CAPSULE | Freq: Three times a day (TID) | ORAL | 0 refills | Status: DC | PRN

## 2021-07-27 MED ORDER — CEFTRIAXONE SODIUM 1 G IJ SOLR
1.0000 g | Freq: Once | INTRAMUSCULAR | Status: AC
  Administered 2021-07-27: 1 g via INTRAMUSCULAR

## 2021-07-27 MED ORDER — DOXYCYCLINE HYCLATE 100 MG PO CAPS: 100.0000 mg | ORAL_CAPSULE | Freq: Two times a day (BID) | ORAL | 0 refills | Status: AC

## 2021-07-27 MED ORDER — HYDROCOD POLST-CPM POLST ER 10-8 MG/5ML PO SUER: 5.0000 mL | Freq: Two times a day (BID) | ORAL | 0 refills | Status: DC | PRN

## 2021-07-27 MED ORDER — IBUPROFEN 600 MG PO TABS: 600.0000 mg | ORAL_TABLET | Freq: Four times a day (QID) | ORAL | 0 refills | Status: DC | PRN

## 2021-07-27 MED ORDER — CEFTRIAXONE SODIUM 1 G IJ SOLR
INTRAMUSCULAR | Status: AC
  Filled 2021-07-27: qty 10

## 2021-07-27 MED ORDER — FLUTICASONE PROPIONATE 50 MCG/ACT NA SUSP: 2.0000 | Freq: Every day | NASAL | 0 refills | Status: AC

## 2021-07-27 NOTE — ED Triage Notes (Signed)
Pt presents with sore throat, congestion, cough and vomiting xs 5-7 days. States has take mucinex and tylenol with some relief.   Pt also c/o abscess under left arm.

## 2021-07-27 NOTE — ED Provider Notes (Signed)
HPI  SUBJECTIVE:  Samantha Harris is a 40 y.o. female who presents with 2 issues.  First, she reports 1 week of URI symptoms with cough, chest congestion, tightness, sore throat secondary to cough.  She reports body aches, posttussive emesis, postnasal drip, chest soreness secondary to the cough and headache that is present with cough only.  She reports occasional emesis that is not associated with coughing.  She states that she had a negative rapid COVID test at work at the beginning of her symptoms.  No fevers, nasal congestion, rhinorrhea, sinus pain or pressure, loss of sense of smell or taste, wheezing, shortness of breath, nausea, diarrhea, abdominal pain.  No antibiotics in the past 3 months.  She took TheraFlu within 6 hours of evaluation.  She has been taking Tylenol, Mucinex, TheraFlu with improvement in her symptoms.  Symptoms worse with coughing.  States that she is unable to sleep at night secondary to the cough.  No GERD symptoms.  No known COVID exposure.  She got the second dose of the COVID-vaccine.  Second, she reports a painful erythematous mass in her left axilla starting 2 days ago.  She states it started purulent, odorous material today and it has started to get smaller.  She shaves her axilla.  She states that she has had similar symptoms before that required I&D.  She has been doing warm compresses, heat, Neosporin with improvement in her symptoms.  Symptoms worse with palpation.  Past medical history of axillary boils, prediabetes.  No history of MRSA, hidradenitis suppurativa, pulmonary disease.  She is a former smoker.  LMP: She has a Nexplanon.  She is not sure if she could be pregnant.  PMD: [facility].    Past Medical History:  Diagnosis Date   Anxiety    Gallstones    HPV in female    Migraine    Vaginal Pap smear, abnormal     Past Surgical History:  Procedure Laterality Date   CHOLECYSTECTOMY N/A 10/12/2020   Procedure: LAPAROSCOPIC CHOLECYSTECTOMY WITH  PRIMARY UMBILICAL CLOSURE;  Surgeon: Emelia Loron, MD;  Location: [facility];  Service: General;  Laterality: N/A;   MOUTH SURGERY      Family History  Problem Relation Age of Onset   Colon cancer Mother 60   Heart disease Father    Hypertension Father    Stroke Father    Diabetes Sister    Asthma Brother    Diabetes Paternal Grandmother    Stroke Paternal Grandmother    Clotting disorder Paternal Grandmother     Social History   Tobacco Use   Smoking status: Former    Types: Cigarettes    Quit date: 10/2018    Years since quitting: 2.7   Smokeless tobacco: Never  Vaping Use   Vaping Use: Never used  Substance Use Topics   Alcohol use: No    Comment: before pregnancy   Drug use: No    Comment: past marijuana    No current facility-administered medications for this encounter.  Current Outpatient Medications:    benzonatate (TESSALON) 200 MG capsule, Take 1 capsule (200 mg total) by mouth 3 (three) times daily as needed for cough., Disp: 30 capsule, Rfl: 0   chlorpheniramine-HYDROcodone (TUSSIONEX PENNKINETIC ER) 10-8 MG/5ML SUER, Take 5 mLs by mouth every 12 (twelve) hours as needed for cough., Disp: 60 mL, Rfl: 0   doxycycline (VIBRAMYCIN) 100 MG capsule, Take 1 capsule (100 mg total) by mouth 2 (two) times daily for 10  days., Disp: 10 capsule, Rfl: 0   fluticasone (FLONASE) 50 MCG/ACT nasal spray, Place 2 sprays into both nostrils daily., Disp: 16 g, Rfl: 0   ibuprofen (ADVIL) 600 MG tablet, Take 1 tablet (600 mg total) by mouth every 6 (six) hours as needed., Disp: 30 tablet, Rfl: 0   Semaglutide (RYBELSUS PO), Take by mouth., Disp: , Rfl:    cholecalciferol (VITAMIN D3) 25 MCG (1000 UNIT) tablet, Take 1,000 Units by mouth daily., Disp: , Rfl:    COVID-19 mRNA vaccine, Pfizer, 30 MCG/0.3ML injection, INJECT AS DIRECTED, Disp: .3 mL, Rfl: 0   ferrous sulfate (FEROSUL) 325 (65 FE) MG tablet, Take 1 tablet (325 mg total) by mouth every other day.,  Disp: 30 tablet, Rfl: 11  No Known Allergies   ROS  As noted in HPI.   Physical Exam  BP 138/74 (BP Location: Right Arm)   Pulse 93   Temp 99 F (37.2 C) (Oral)   Resp 18   SpO2 98%   Constitutional: Well developed, well nourished, no acute distress Eyes:  EOMI, conjunctiva normal bilaterally HENT: Normocephalic, atraumatic,mucus membranes moist.  Positive nasal congestion.  Erythematous, swollen turbinates.  No maxillary, frontal sinus tenderness.  Normal oropharynx.  Positive postnasal drip. Respiratory: Normal inspiratory effort, lungs clear bilaterally Cardiovascular: Normal rate, regular rhythm, no murmurs rubs or gallops GI: nondistended skin: Large tender erythematous mass with central fluctuance left axilla.  No expressible purulent drainage.    Musculoskeletal: no deformities Neurologic: Alert & oriented x 3, no focal neuro deficits Psychiatric: Speech and behavior appropriate   ED Course   Medications  cefTRIAXone (ROCEPHIN) injection 1 g (1 g Intramuscular Given 07/27/21 2007)    Orders Placed This Encounter  Procedures   SARS CORONAVIRUS 2 (TAT 6-24 HRS) Nasopharyngeal Nasopharyngeal Swab    Standing Status:   Standing    Number of Occurrences:   1    Order Specific Question:   Is this test for diagnosis or screening    Answer:   Diagnosis of ill patient    Order Specific Question:   Symptomatic for COVID-19 as defined by CDC    Answer:   Yes    Order Specific Question:   Date of Symptom Onset    Answer:   07/20/2021    Order Specific Question:   Hospitalized for COVID-19    Answer:   No    Order Specific Question:   Admitted to ICU for COVID-19    Answer:   No    Order Specific Question:   Previously tested for COVID-19    Answer:   Yes    Order Specific Question:   Resident in a congregate (group) care setting    Answer:   No    Order Specific Question:   Employed in healthcare setting    Answer:   No    Order Specific Question:   Pregnant     Answer:   No    Order Specific Question:   Has patient completed COVID vaccination(s) (2 doses of Pfizer/Moderna 1 dose of [employer])    Answer:   Yes    Order Specific Question:   Has patient completed COVID Booster / 3rd dose    Answer:   No   POC urine pregnancy    Standing Status:   Standing    Number of Occurrences:   1    Results for orders placed or performed during the hospital encounter of 07/27/21 (from the past 24 hour(s))  SARS CORONAVIRUS 2 (TAT 6-24 HRS) Nasopharyngeal Nasopharyngeal Swab     Status: None   Collection Time: 07/27/21  7:43 PM   Specimen: Nasopharyngeal Swab  Result Value Ref Range   SARS Coronavirus 2 NEGATIVE NEGATIVE  POC urine pregnancy     Status: None   Collection Time: 07/27/21  7:48 PM  Result Value Ref Range   Preg Test, Ur NEGATIVE NEGATIVE   No results found.  ED Clinical Impression  1. Viral URI with cough   2. Encounter for laboratory testing for COVID-19 virus   3. Axillary abscess      ED Assessment/Plan  [place] Narcotic database reviewed for this patient, and feel that the risk/benefit ratio today is favorable for proceeding with a prescription for controlled substance.  No narcotic prescriptions since October 2021  Checking urine pregnancy as patient is unsure if she could be pregnant.  Urine pregnancy negative  1.  URI.  Will check COVID although she is out of the window for treatment.  Suspect the cough is coming from postnasal drip.  Will send home with Flonase, saline nasal irrigation, Tessalon, Tussionex.  Her lungs are clear, she has no history of pulmonary disease, do not think that she needs an albuterol inhaler with a spacer.  COVID-negative.  Supportive treatment, plan as above  2.  Left axillary abscess.  Discussed with patient that this needs to be drained.  She declined an I&D today.  We will give her a shot of Rocephin, send home on 10 days of doxycycline which will also cover a sinus infection and a pneumonia as  well although I think that the likelihood of her having pneumonia is very low.  Ibuprofen/Tylenol 3-4 times a day as needed for pain.  Continue heat.  Follow-up with her doctor or return here in 1 to 2 days if it is not spontaneously draining for an I&D.  Discussed MDM, treatment plan, and plan for follow-up with patient.  patient agrees with plan.   Meds ordered this encounter  Medications   cefTRIAXone (ROCEPHIN) injection 1 g   doxycycline (VIBRAMYCIN) 100 MG capsule    Sig: Take 1 capsule (100 mg total) by mouth 2 (two) times daily for 10 days.    Dispense:  10 capsule    Refill:  0   ibuprofen (ADVIL) 600 MG tablet    Sig: Take 1 tablet (600 mg total) by mouth every 6 (six) hours as needed.    Dispense:  30 tablet    Refill:  0   benzonatate (TESSALON) 200 MG capsule    Sig: Take 1 capsule (200 mg total) by mouth 3 (three) times daily as needed for cough.    Dispense:  30 capsule    Refill:  0   chlorpheniramine-HYDROcodone (TUSSIONEX PENNKINETIC ER) 10-8 MG/5ML SUER    Sig: Take 5 mLs by mouth every 12 (twelve) hours as needed for cough.    Dispense:  60 mL    Refill:  0   fluticasone (FLONASE) 50 MCG/ACT nasal spray    Sig: Place 2 sprays into both nostrils daily.    Dispense:  16 g    Refill:  0      *This clinic note was created using Scientist, clinical (histocompatibility and immunogenetics). Therefore, there may be occasional mistakes despite careful proofreading.  ?    Domenick Gong, MD 07/28/21 [phone]

## 2021-07-27 NOTE — Discharge Instructions (Addendum)
Your COVID test will be back in 12 to 24 hours.  Flonase, saline nasal irrigation with a [employer] sinus rinse and distilled water as often as you want.  Tessalon for the cough during the day, Tussionex for the cough at night.  Continue Mucinex DM.  Take 600 mg of ibuprofen combined with 1000 mg of Tylenol together 3-4 times a day as needed for pain.  Continue warm, moist compresses in your axilla.  I have given you a shot of Rocephin to start treatment and finish the doxycycline, even if you feel better.  This needs to be drained.  Return here or see your doctor in 2 days if it has not started draining on its own.

## 2021-07-28 LAB — SARS CORONAVIRUS 2 (TAT 6-24 HRS): SARS Coronavirus 2: NEGATIVE

## 2022-03-07 DIAGNOSIS — R059 Cough, unspecified: Secondary | ICD-10-CM | POA: Diagnosis not present

## 2022-03-07 DIAGNOSIS — U071 COVID-19: Secondary | ICD-10-CM | POA: Diagnosis not present

## 2022-04-26 IMAGING — MR MR ABDOMEN WO/W CM
18 series · 48 of 48 positions shown · IV contrast (gadavist)
Comparison: Abdominal ultrasound 08/06/2020.

CLINICAL DATA: Right hepatic lobe hyperechoic masses. Right upper
quadrant pain.

EXAM:
MRI ABDOMEN WITHOUT AND WITH CONTRAST
TECHNIQUE: Multiplanar multisequence MR imaging of the abdomen was performed
both before and after the administration of intravenous contrast.
CONTRAST:  10mL GADAVIST GADOBUTROL 1 MMOL/ML IV SOLN

[Series 3: T2 · coronal · 6.0mm · 1.56mm/px · 3 of 40 slices shown (1 of 2)]
[im 1/40]
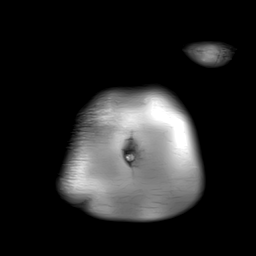
[im 20/40]
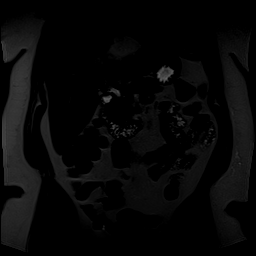
[im 40/40]
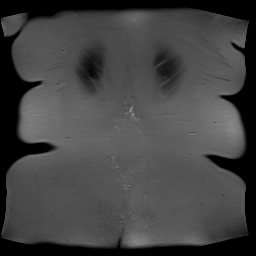

[Series 4: T2 fat-sat · 2 of 5 slices shown (1 of 2)]
[im 1/5]
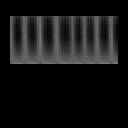
[im 5/5]
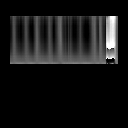

[Series 5: T2 fat-sat · axial · 6.0mm · 1.28mm/px · z∈[-69,+183]mm · 2 of 36 slices shown (2 of 2)]
[im 1/36]
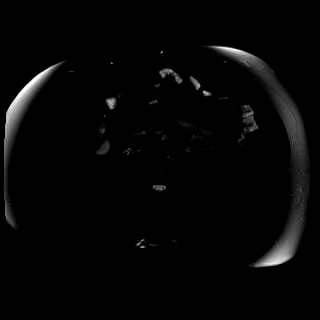
[im 36/36]
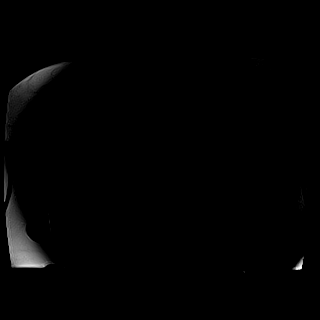

[Series 6: T1 · axial · 3.0mm · 1.25mm/px · z∈[-91,+170]mm · 4 of 88 slices shown (1 of 2)]
[im 1/88]
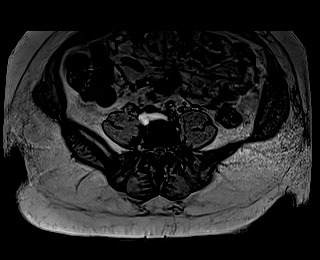
[im 30/88]
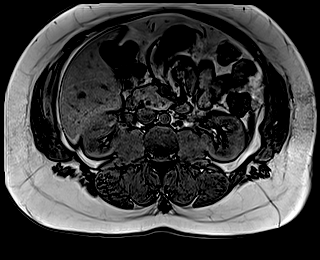
[im 59/88]
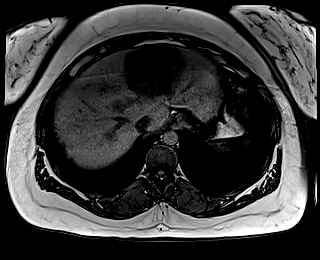
[im 88/88]
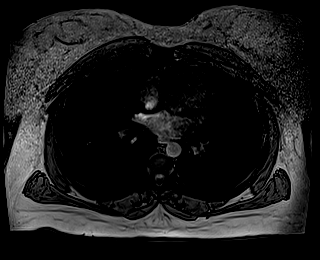

[Series 7: T1 · axial · 3.0mm · 1.25mm/px · z∈[-91,+170]mm · 3 of 88 slices shown (2 of 2)]
[im 1/88]
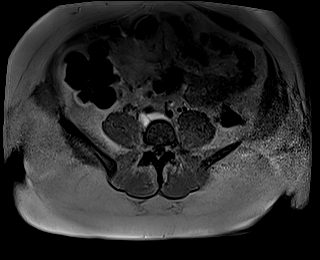
[im 44/88]
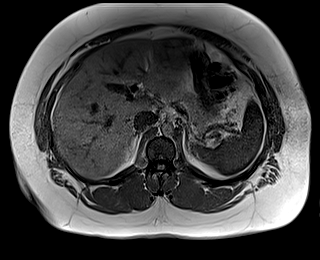
[im 88/88]
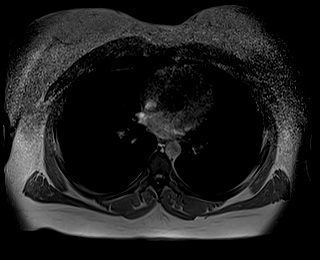

[Series 8: DWI · axial · 6.0mm · 1.49mm/px · z∈[-111,+156]mm · 3 of 76 slices shown (1 of 2)]
[im 1/76]
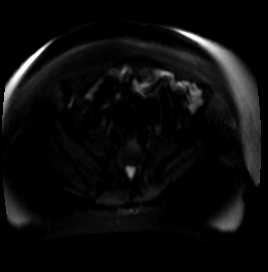
[im 38/76]
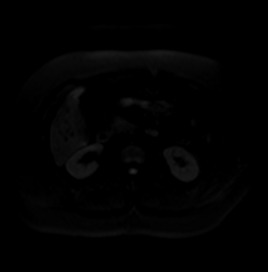
[im 76/76]
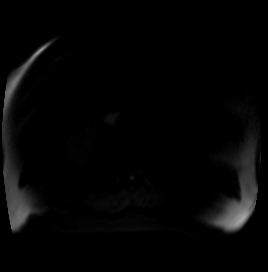

[Series 9: DWI · axial · 6.0mm · 1.49mm/px · 1 of 38 slices shown (2 of 2)]
[im 1/38]
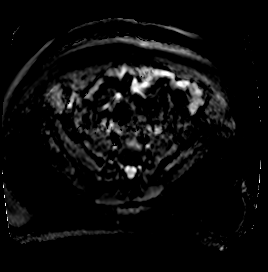

[Series 10: bSSFP · axial · 4.0mm · 0.84mm/px · z∈[-86,+155]mm · 2 of 60 slices shown]
[im 1/60]
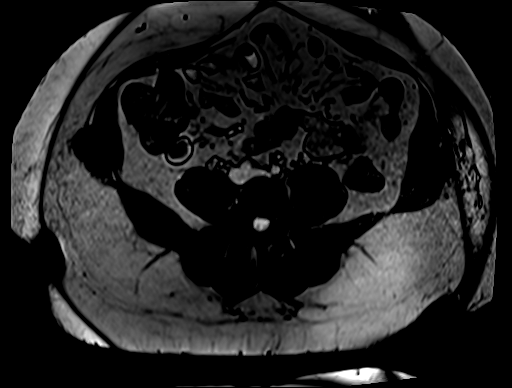
[im 60/60]
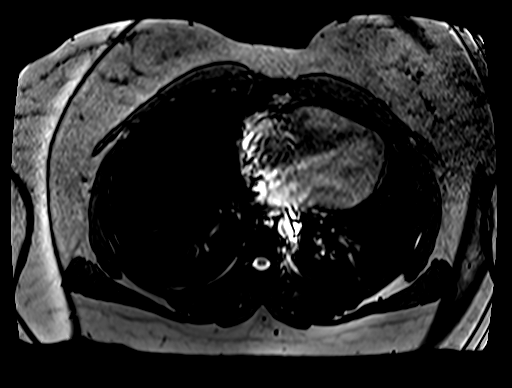

[Series 12: T1 dynamic · axial · 3.0mm · 1.25mm/px · z∈[-98,+163]mm · 3 of 88 slices shown (1 of 6)]
[im 1/88]
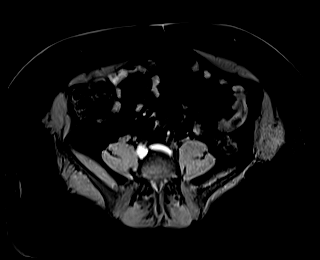
[im 44/88]
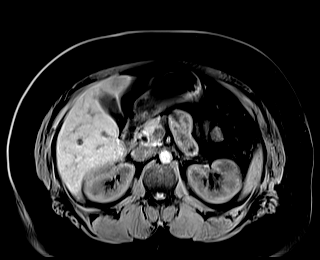
[im 88/88]
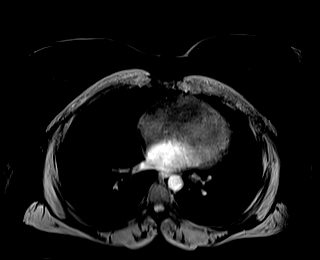

[Series 15: T1 dynamic · axial · 3.0mm · 1.25mm/px · z∈[-98,+163]mm · 3 of 88 slices shown (2 of 6)]
[im 1/88]
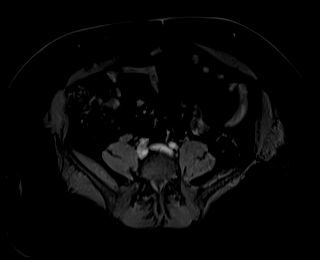
[im 44/88]
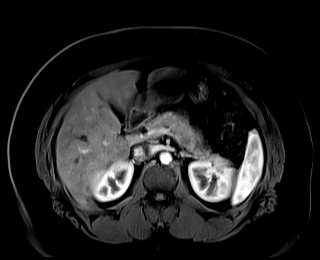
[im 88/88]
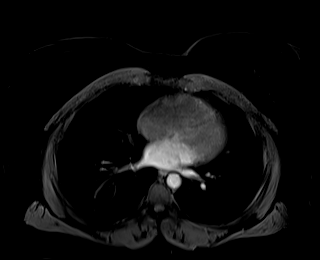

[Series 17: T1 dynamic · axial · 3.0mm · 1.25mm/px · z∈[-98,+163]mm · 3 of 88 slices shown (3 of 6)]
[im 1/88]
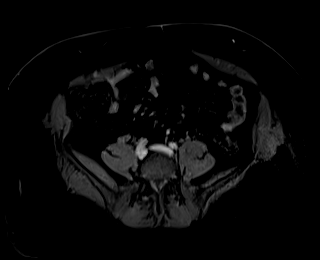
[im 44/88]
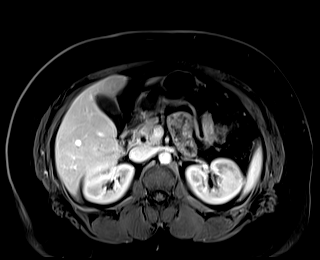
[im 88/88]
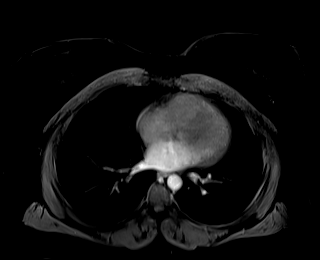

[Series 19: T1 dynamic · axial · 3.0mm · 1.25mm/px · z∈[-98,+163]mm · 3 of 88 slices shown (4 of 6)]
[im 1/88]
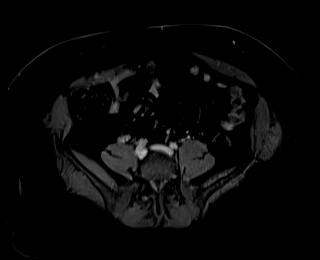
[im 44/88]
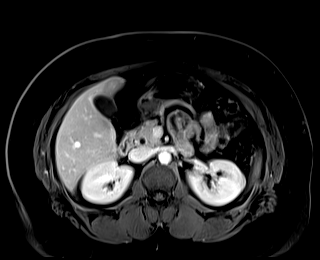
[im 88/88]
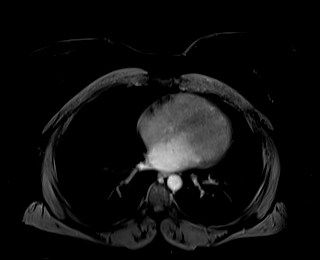

[Series 21: T1 dynamic · coronal · 3.0mm · 1.41mm/px · 3 of 88 slices shown (5 of 6)]
[im 1/88]
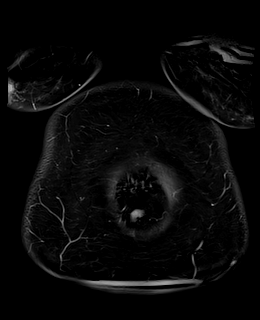
[im 44/88]
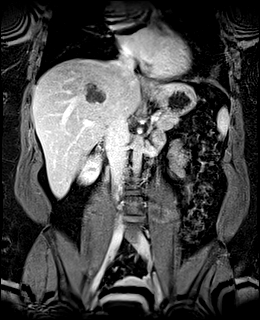
[im 88/88]
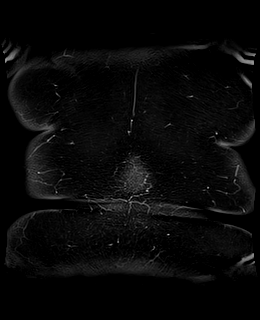

[Series 22: T2 · axial · 6.0mm · 1.58mm/px · 1 of 38 slices shown (2 of 2)]
[im 1/38]
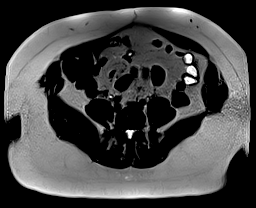

[Series 24: T1 dynamic · axial · 3.0mm · 1.25mm/px · z∈[-98,+163]mm · 3 of 88 slices shown (6 of 6)]
[im 1/88]
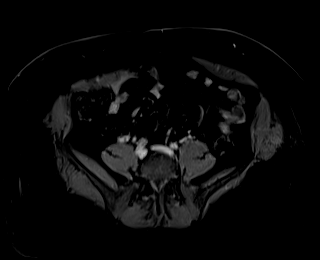
[im 44/88]
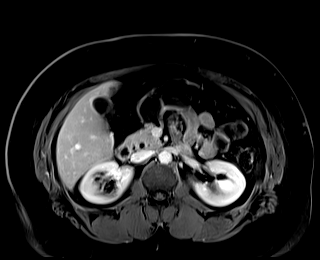
[im 88/88]
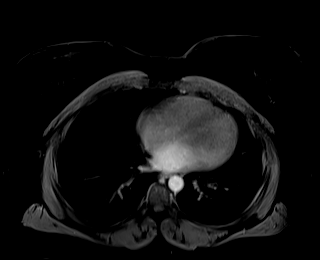

[Series 100: sub_arterial · axial · 3.0mm · 1.25mm/px · z∈[-98,+163]mm · 3 of 87 slices shown]
[im 1/87]
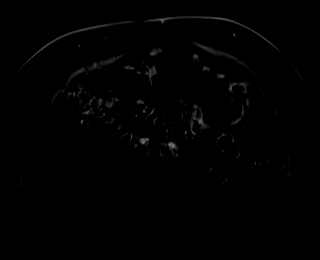
[im 44/87]
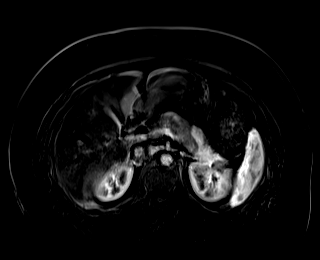
[im 87/87]
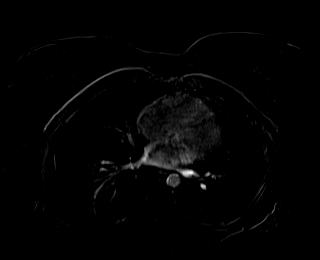

[Series 101: sub_45 sec delay · axial · delayed · 3.0mm · 1.25mm/px · z∈[-98,+163]mm · 3 of 88 slices shown]
[im 1/88]
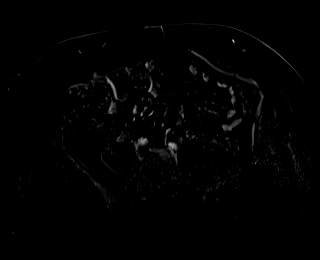
[im 44/88]
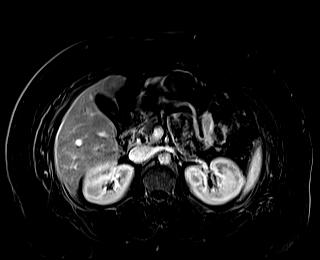
[im 88/88]
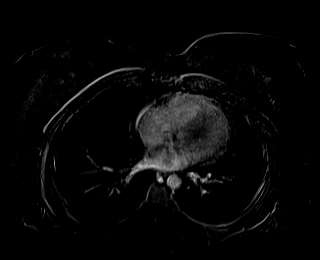

[Series 102: sub_90 sec delay · axial · delayed · 3.0mm · 1.25mm/px · z∈[-98,+163]mm · 3 of 88 slices shown]
[im 1/88]
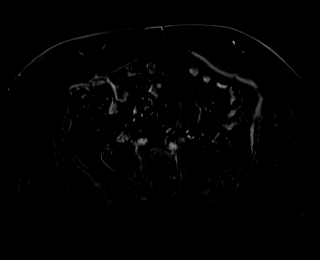
[im 44/88]
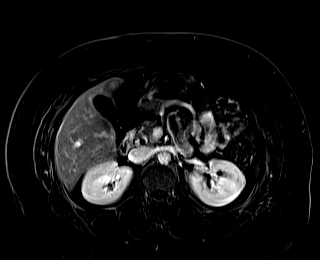
[im 88/88]
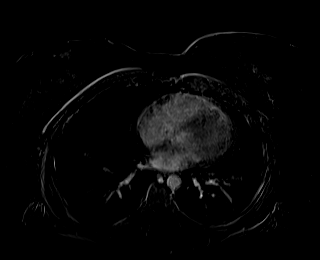

[48 of 48 positions shown; findings below may reference images not displayed]

FINDINGS: Lower chest: Normal heart size without pericardial or pleural
effusion.

Hepatobiliary: Hepatomegaly at 18.7 cm craniocaudal. No cirrhosis.
Multiple liver lesions with imaging characteristics (T2
hyperintensity, early peripheral nodular enhancement with delayed
contrast fill) consistent with cavernous hemangiomas. The largest is
within segments 2 and 4, measuring 8.3 cm on [DATE]. Other lesions
are seen within segment 8 at 2.8 cm on [DATE], segment 7 at 1.9 cm on
[DATE], more inferiorly in segment 7 at 6 mm on [DATE] and within
segment 6 at approximately 7 mm on [DATE]. No suspicious liver
lesions.

Multiple small gallstones including on [DATE]. Common duct normal, 7
mm. No choledocholithiasis.

Pancreas:  Normal, without mass or ductal dilatation.

Spleen:  Normal in size, without focal abnormality.

Adrenals/Urinary Tract: Normal adrenal glands. Normal kidneys,
without hydronephrosis.

Stomach/Bowel: Normal stomach and abdominal bowel loops.

Vascular/Lymphatic: Aortic atherosclerosis. No abdominal adenopathy.

Other:  No ascites.

Musculoskeletal: No acute osseous abnormality.
IMPRESSION: 1. Multiple hepatic hemangiomas, including a dominant giant
hemangioma within the high left hepatic lobe.
2. Cholelithiasis without biliary duct dilatation or
choledocholithiasis. No acute cholecystitis.
3.  Aortic Atherosclerosis ([id number]).  This is age advanced.
4. Mild hepatomegaly.

## 2022-06-08 ENCOUNTER — Other Ambulatory Visit: Payer: Self-pay

## 2022-06-08 ENCOUNTER — Encounter (HOSPITAL_COMMUNITY): Payer: Self-pay

## 2022-06-08 ENCOUNTER — Emergency Department (HOSPITAL_COMMUNITY)
Admission: EM | Admit: 2022-06-08 | Discharge: 2022-06-08 | Disposition: A | Discharge status: Home / Self Care | Payer: Medicaid Other | Attending: Student | Admitting: Student

## 2022-06-08 DIAGNOSIS — S6991XA Unspecified injury of right wrist, hand and finger(s), initial encounter: Secondary | ICD-10-CM | POA: Diagnosis present

## 2022-06-08 DIAGNOSIS — W260XXA Contact with knife, initial encounter: Secondary | ICD-10-CM | POA: Insufficient documentation

## 2022-06-08 DIAGNOSIS — L0291 Cutaneous abscess, unspecified: Secondary | ICD-10-CM

## 2022-06-08 DIAGNOSIS — Y92000 Kitchen of unspecified non-institutional (private) residence as  the place of occurrence of the external cause: Secondary | ICD-10-CM | POA: Diagnosis not present

## 2022-06-08 DIAGNOSIS — S61210A Laceration without foreign body of right index finger without damage to nail, initial encounter: Secondary | ICD-10-CM | POA: Insufficient documentation

## 2022-06-08 DIAGNOSIS — L02412 Cutaneous abscess of left axilla: Secondary | ICD-10-CM | POA: Diagnosis not present

## 2022-06-08 MED ORDER — LIDOCAINE HCL (PF) 1 % IJ SOLN
5.0000 mL | Freq: Once | INTRAMUSCULAR | Status: AC
  Administered 2022-06-08: 5 mL
  Filled 2022-06-08: qty 5

## 2022-06-08 MED ORDER — DOXYCYCLINE HYCLATE 100 MG PO CAPS: 100.0000 mg | ORAL_CAPSULE | Freq: Two times a day (BID) | ORAL | 0 refills | Status: DC

## 2022-06-08 MED ORDER — DOXYCYCLINE HYCLATE 100 MG PO CAPS: 100.0000 mg | ORAL_CAPSULE | Freq: Two times a day (BID) | ORAL | 0 refills | Status: AC

## 2022-06-08 NOTE — ED Provider Triage Note (Signed)
She didEmergency Medicine Provider Triage Evaluation Note  Samantha Harris , a 41 y.o. female  was evaluated in triage.  Pt complains of laceration to left index finger, happened while she was cleaning one of her kitchen knives, this is a brand-new knife, this was accidental, as any paresthesias or weakness in her left index finger, has full range of motion in her left finger, and hurts when she bends it.  She is up-to-date on her tetanus shot not immunocompromise.  She also has an abscess underneath her left arm, she has drainage discharge coming from it she has had this in the past..  Review of Systems  Positive: Laceration, abscess Negative: Paresthesias weakness  Physical Exam  BP (!) 142/90   Pulse 82   Temp 98.6 F (37 C) (Oral)   Resp 18   Ht 5\' 7"  (1.702 m)   Wt 110.7 kg   SpO2 100%   BMI 38.22 kg/m  Gen:   Awake, no distress   Resp:  Normal effort  MSK:   Moves extremities without difficulty  Other:  Patient has a 2 cm laceration on the palmar aspect of the left index finger, hemodynamically stable, no obvious ligament and tendon damage present.  Medical Decision Making  Medically screening exam initiated at 12:53 AM.  Appropriate orders placed.  Samantha Harris was informed that the remainder of the evaluation will be completed by another provider, this initial triage assessment does not replace that evaluation, and the importance of remaining in the ED until their evaluation is complete.  Patient will need further work-up here in the emergency department.   Clinton Quant, PA-C 06/08/22 [phone]

## 2022-06-08 NOTE — Discharge Instructions (Signed)
-  Laceration-you have a total of 3 sutures, please refrain from getting wet for the first 24 hours, after that I like you to rinse out the wound change at the dressing 2 times daily.  Started on antibiotics please take as prescribed.  Please follow-up in the next 10 days for suture removal may come back here urgent care or your PCP. Abscess-started on antibiotics take as prescribed, I recommend applying warm compress to the area 2 times daily and extruding the pus out of it.  If you note after 4 days of antibiotic use the area is getting larger or become more painful or symptoms are worsening I recommend coming back in as you will likely need an I&D.  Come back to the emergency department if you develop chest pain, shortness of breath, severe abdominal pain, uncontrolled nausea, vomiting, diarrhea.

## 2022-06-08 NOTE — ED Triage Notes (Signed)
Pt cut index finger on right hand with knife at 0015. Finger is wrapped and bleeding is controlled. Pt also has abscess for one month in left axilla. Draining yellow drainage and has odor. She has had these before but not lasting this long

## 2022-06-08 NOTE — ED Provider Notes (Signed)
[facility] EMERGENCY DEPARTMENT Provider Note   CSN: [id number] Arrival date & time: 06/08/22  0033     History  Chief Complaint  Patient presents with   Laceration   Abscess    Samantha Harris is a 41 y.o. female.  HPI  Presents with laceration to the right index finger, happened while she was using 1 to her kitchen knives, this was a brand-new knife, she cut the palmar as well as the lateral aspect of the right index finger, bleeding was controlled, Sowles full sensation in her finger and able to move all joints.  She is up-to-date on her tetanus shot, she is not immunocompromise.  Also notes that she has an abscess underneath her left arm been there for about a month it is actively draining and has gotten smaller in size.  Home Medications Prior to Admission medications   Medication Sig Start Date End Date Taking? Authorizing Provider  doxycycline (VIBRAMYCIN) 100 MG capsule Take 1 capsule (100 mg total) by mouth 2 (two) times daily for 7 days. 06/08/22 06/15/22 Yes Carroll Sage, PA-C  benzonatate (TESSALON) 200 MG capsule Take 1 capsule (200 mg total) by mouth 3 (three) times daily as needed for cough. 07/27/21   Domenick Gong, MD  chlorpheniramine-HYDROcodone [facility] PENNKINETIC ER) 10-8 MG/5ML SUER Take 5 mLs by mouth every 12 (twelve) hours as needed for cough. 07/27/21   Domenick Gong, MD  cholecalciferol (VITAMIN D3) 25 MCG (1000 UNIT) tablet Take 1,000 Units by mouth daily.    [provider]  ferrous sulfate (FEROSUL) 325 (65 FE) MG tablet Take 1 tablet (325 mg total) by mouth every other day. 01/06/21   Brock Bad, MD  fluticasone (FLONASE) 50 MCG/ACT nasal spray Place 2 sprays into both nostrils daily. 07/27/21   Domenick Gong, MD  ibuprofen (ADVIL) 600 MG tablet Take 1 tablet (600 mg total) by mouth every 6 (six) hours as needed. 07/27/21   Domenick Gong, MD  Semaglutide (RYBELSUS PO) Take by mouth.    [provider]      Allergies    Patient has no known allergies.    Review of Systems   Review of Systems  Constitutional:  Negative for chills and fever.  Respiratory:  Negative for shortness of breath.   Cardiovascular:  Negative for chest pain.  Gastrointestinal:  Negative for abdominal pain.  Skin:  Positive for wound.  Neurological:  Negative for headaches.    Physical Exam Updated Vital Signs BP (!) 142/90   Pulse 82   Temp 98.6 F (37 C) (Oral)   Resp 18   Ht 5\' 7"  (1.702 m)   Wt 110.7 kg   SpO2 100%   BMI 38.22 kg/m  Physical Exam Vitals and nursing note reviewed.  Constitutional:      General: She is not in acute distress.    Appearance: She is not ill-appearing.  HENT:     Head: Normocephalic and atraumatic.     Nose: No congestion.  Eyes:     Conjunctiva/sclera: Conjunctivae normal.  Cardiovascular:     Rate and Rhythm: Normal rate and regular rhythm.     Pulses: Normal pulses.     Heart sounds:     No gallop.  Pulmonary:     Effort: Pulmonary effort is normal.  Musculoskeletal:     Comments: Patient has a V-shaped laceration which extends on the palmar aspect of the right index finger distal to the PIP joint extends onto the  lateral aspect of the finger, hemodynamically stable, measuring approximately 3 cm in length, about 2 mm in depth, no ligament or foreign bodies present.  She has full range of motion in all joints of finger to second cap refill.  She also has noted abscess on her left axilla, fluctuance present no induration noted is actively draining, no overlying erythema or edema present.  Area measures approximately 1 cm in diameter.  Skin:    General: Skin is warm and dry.  Neurological:     Mental Status: She is alert.  Psychiatric:        Mood and Affect: Mood normal.     ED Results / Procedures / Treatments   Labs (all labs ordered are listed, but only abnormal results are displayed) Labs Reviewed - No data to  display  EKG None  Radiology No results found.  Procedures .Marland KitchenLaceration Repair  Date/Time: 06/08/2022 3:17 AM  Performed by: Carroll Sage, PA-C Authorized by: Carroll Sage, PA-C   Consent:    Consent obtained:  Verbal   Consent given by:  Patient   Risks discussed:  Infection, pain, retained foreign body, need for additional repair, poor cosmetic result, tendon damage, vascular damage, poor wound healing and nerve damage   Alternatives discussed:  No treatment, delayed treatment, observation and referral Universal protocol:    Patient identity confirmed:  Verbally with patient Anesthesia:    Anesthesia method:  Nerve block   Block needle gauge:  24 G   Block anesthetic:  Lidocaine 1% w/o epi   Block injection procedure:  Anatomic landmarks identified   Block outcome:  Anesthesia achieved Laceration details:    Location:  Finger   Finger location:  R index finger   Length (cm):  3   Depth (mm):  2 Pre-procedure details:    Preparation:  Patient was prepped and draped in usual sterile fashion Exploration:    Limited defect created (wound extended): no     Imaging outcome: foreign body not noted     Wound exploration: wound explored through full range of motion and entire depth of wound visualized     Contaminated: no   Treatment:    Area cleansed with:  Saline   Amount of cleaning:  Standard   Visualized foreign bodies/material removed: no   Skin repair:    Repair method:  Sutures   Suture size:  4-0   Suture material:  Prolene   Suture technique:  Simple interrupted   Number of sutures:  3 Approximation:    Approximation:  Close Repair type:    Repair type:  Simple Post-procedure details:    Dressing:  Non-adherent dressing   Procedure completion:  Tolerated well, no immediate complications     Medications Ordered in ED Medications  lidocaine (PF) (XYLOCAINE) 1 % injection 5 mL (5 mLs Infiltration Given 06/08/22 0252)    ED Course/ Medical  Decision Making/ A&P                           Medical Decision Making Risk Prescription drug management.   This patient presents to the ED for concern of finger laceration and abscess, this involves an extensive number of treatment options, and is a complaint that carries with it a high risk of complications and morbidity.  The differential diagnosis includes tendon damage, cellulitis, foreign body    Additional history obtained:  Additional history obtained from N/A External records from outside source obtained and  reviewed including immunization records   Co morbidities that complicate the patient evaluation  N/A  Social Determinants of Health:  N/A    Lab Tests:  I Ordered, and personally interpreted labs.  The pertinent results include: N/A   Imaging Studies ordered:  I ordered imaging studies including N/A I independently visualized and interpreted imaging which showed N/A I agree with the radiologist interpretation   Cardiac Monitoring:  The patient was maintained on a cardiac monitor.  I personally viewed and interpreted the cardiac monitored which showed an underlying rhythm of: N/A   Medicines ordered and prescription drug management:  I ordered medication including lidocaine I have reviewed the patients home medicines and have made adjustments as needed  Critical Interventions:  N/A   Reevaluation:  Presents with laceration to the right index finger, patient tolerated suture repair, received total of 3 sutures.  I recommend I&D of the left toe abscess but patient is deferring she would like to see if it will go away on its own with antibiotics and since it is actively draining.  Consultations Obtained:  N/A   Test Considered:  Imaging of right index finger-deferred respiration for foreign body is very low at this time and her fracture no traumatic injury associate with this injury, no deformities noted.    Rule out  . low suspicion  for ligament or tendon damage as area was palpated no gross defects noted, she had full range of motion in his right index finger.  Low suspicion for compartment syndrome as area was palpated it was soft to the touch, neurovascular fully intact before and after the procedure.  I have low suspicion for necrotizing fasciitis of the left axilla as presentation is atypical of etiology.     Dispostion and problem list  After consideration of the diagnostic results and the patients response to treatment, I feel that the patent would benefit from discharge.  1.  Finger laceration-received total of 3 sutures, will recommend basic wound care, follow-up next 10 days for suture removal.  Strict return precautions 2.  Abscess-patient is deferring on I&D at this time, explained that there is a high risk that this will not resolve on its own, will start her on doxycycline as this will cover for MRSA, recommend warm compresses keep the area clean and strict return precautions.            Final Clinical Impression(s) / ED Diagnoses Final diagnoses:  Laceration of right index finger without foreign body without damage to nail, initial encounter  Abscess    Rx / DC Orders ED Discharge Orders          Ordered    doxycycline (VIBRAMYCIN) 100 MG capsule  2 times daily        06/08/22 0322              Carroll SageFaulkner, Traye Bates J, PA-C 06/08/22 0324    Nira Connardama, Pedro Eduardo, MD 06/08/22 [id number]

## 2022-06-20 DIAGNOSIS — Z4802 Encounter for removal of sutures: Secondary | ICD-10-CM | POA: Diagnosis not present

## 2022-11-25 DIAGNOSIS — E559 Vitamin D deficiency, unspecified: Secondary | ICD-10-CM | POA: Diagnosis not present

## 2022-11-25 DIAGNOSIS — D539 Nutritional anemia, unspecified: Secondary | ICD-10-CM | POA: Diagnosis not present

## 2022-11-25 DIAGNOSIS — Z6837 Body mass index (BMI) 37.0-37.9, adult: Secondary | ICD-10-CM | POA: Diagnosis not present

## 2022-11-25 DIAGNOSIS — Z0130 Encounter for examination of blood pressure without abnormal findings: Secondary | ICD-10-CM | POA: Diagnosis not present

## 2022-11-25 DIAGNOSIS — Z0000 Encounter for general adult medical examination without abnormal findings: Secondary | ICD-10-CM | POA: Diagnosis not present

## 2022-11-25 DIAGNOSIS — R030 Elevated blood-pressure reading, without diagnosis of hypertension: Secondary | ICD-10-CM | POA: Diagnosis not present

## 2022-11-25 DIAGNOSIS — N6315 Unspecified lump in the right breast, overlapping quadrants: Secondary | ICD-10-CM | POA: Diagnosis not present

## 2022-11-25 DIAGNOSIS — R5383 Other fatigue: Secondary | ICD-10-CM | POA: Diagnosis not present

## 2022-12-14 ENCOUNTER — Ambulatory Visit: Payer: Self-pay

## 2022-12-14 ENCOUNTER — Other Ambulatory Visit: Payer: Self-pay | Admitting: Family Medicine

## 2022-12-14 DIAGNOSIS — M545 Low back pain, unspecified: Secondary | ICD-10-CM

## 2022-12-28 DIAGNOSIS — N6315 Unspecified lump in the right breast, overlapping quadrants: Secondary | ICD-10-CM | POA: Diagnosis not present

## 2022-12-28 DIAGNOSIS — E559 Vitamin D deficiency, unspecified: Secondary | ICD-10-CM | POA: Diagnosis not present

## 2022-12-28 DIAGNOSIS — D509 Iron deficiency anemia, unspecified: Secondary | ICD-10-CM | POA: Diagnosis not present

## 2022-12-28 DIAGNOSIS — R7303 Prediabetes: Secondary | ICD-10-CM | POA: Diagnosis not present

## 2022-12-28 DIAGNOSIS — Z3200 Encounter for pregnancy test, result unknown: Secondary | ICD-10-CM | POA: Diagnosis not present

## 2022-12-28 DIAGNOSIS — L989 Disorder of the skin and subcutaneous tissue, unspecified: Secondary | ICD-10-CM | POA: Diagnosis not present

## 2022-12-28 DIAGNOSIS — Z6837 Body mass index (BMI) 37.0-37.9, adult: Secondary | ICD-10-CM | POA: Diagnosis not present

## 2022-12-28 DIAGNOSIS — Z0130 Encounter for examination of blood pressure without abnormal findings: Secondary | ICD-10-CM | POA: Diagnosis not present

## 2022-12-28 DIAGNOSIS — L0292 Furuncle, unspecified: Secondary | ICD-10-CM | POA: Diagnosis not present

## 2023-01-13 DIAGNOSIS — M7989 Other specified soft tissue disorders: Secondary | ICD-10-CM | POA: Diagnosis not present

## 2023-01-13 DIAGNOSIS — M79644 Pain in right finger(s): Secondary | ICD-10-CM | POA: Diagnosis not present

## 2023-01-13 DIAGNOSIS — M79641 Pain in right hand: Secondary | ICD-10-CM | POA: Diagnosis not present

## 2023-01-17 ENCOUNTER — Ambulatory Visit: Payer: Medicaid Other | Admitting: Obstetrics & Gynecology

## 2023-01-18 ENCOUNTER — Ambulatory Visit (INDEPENDENT_AMBULATORY_CARE_PROVIDER_SITE_OTHER): Payer: Medicaid Other | Admitting: Obstetrics and Gynecology

## 2023-01-18 ENCOUNTER — Encounter: Payer: Self-pay | Admitting: Obstetrics and Gynecology

## 2023-01-18 VITALS — BP 122/81 | HR 84 | Wt 238.0 lb

## 2023-01-18 DIAGNOSIS — Z3046 Encounter for surveillance of implantable subdermal contraceptive: Secondary | ICD-10-CM | POA: Insufficient documentation

## 2023-01-18 NOTE — Patient Instructions (Signed)
Nexplanon Instructions After Insertion  Keep bandage clean and dry for 24 hours  May use ice/Tylenol/Ibuprofen for soreness or pain  If you develop fever, drainage or increased warmth from incision site-contact office immediately   

## 2023-01-18 NOTE — Progress Notes (Signed)
     GYNECOLOGY CLINIC PROCEDURE NOTE  Samantha Harris is a 42 y.o. [id number] here for Nexplanon removal and  Nexplanon insertion.  L  Nexplanon Removal and Insertion  Patient identified, informed consent performed, consent signed.   Patient does understand that irregular bleeding is a very common side effect of this medication. She was advised to have backup contraception for one week after replacement of the implant. Pregnancy test in clinic today was negative.  Appropriate time out taken. Implanon site identified. Area prepped in usual sterile fashon. One ml of 1% lidocaine was used to anesthetize the area at the distal end of the implant. A small stab incision was made right beside the implant on the distal portion. The Nexplanon rod was grasped using hemostats and removed without difficulty. There was minimal blood loss. There were no complications. Area was then injected with 3 ml of 1 % lidocaine. She was re-prepped with betadine, Nexplanon removed from packaging, Device confirmed in needle, then inserted full length of needle and withdrawn per handbook instructions. Nexplanon was able to palpated in the patient's arm; patient palpated the insert herself.  There was minimal blood loss. Patient insertion site covered with guaze and a pressure bandage to reduce any bruising. The patient tolerated the procedure well and was given post procedure instructions.  She was advised to have backup contraception for one week.    She will return in 4 weeks for insertion check and yearly GYN exam  Arlina Robes, MD, [place] Attending [place] for [facility], [place]

## 2023-01-18 NOTE — Progress Notes (Signed)
Pt in office for Nexplanon replacement. Will schedule AEX at check out today.

## 2023-01-19 DIAGNOSIS — N6325 Unspecified lump in the left breast, overlapping quadrants: Secondary | ICD-10-CM | POA: Diagnosis not present

## 2023-01-19 DIAGNOSIS — N6315 Unspecified lump in the right breast, overlapping quadrants: Secondary | ICD-10-CM | POA: Diagnosis not present

## 2023-01-19 MED ORDER — ETONOGESTREL 68 MG ~~LOC~~ IMPL
68.0000 mg | DRUG_IMPLANT | Freq: Once | SUBCUTANEOUS | Status: AC
  Administered 2023-01-18: 68 mg via SUBCUTANEOUS

## 2023-01-19 NOTE — Addendum Note (Signed)
Addended by: Lewie Loron D on: 01/19/2023 08:17 AM   Modules accepted: Orders

## 2023-07-27 DIAGNOSIS — R928 Other abnormal and inconclusive findings on diagnostic imaging of breast: Secondary | ICD-10-CM | POA: Diagnosis not present

## 2023-07-27 DIAGNOSIS — N6320 Unspecified lump in the left breast, unspecified quadrant: Secondary | ICD-10-CM | POA: Diagnosis not present

## 2024-01-10 DIAGNOSIS — M25561 Pain in right knee: Secondary | ICD-10-CM | POA: Diagnosis not present

## 2024-01-10 DIAGNOSIS — Z13220 Encounter for screening for lipoid disorders: Secondary | ICD-10-CM | POA: Diagnosis not present

## 2024-01-10 DIAGNOSIS — E669 Obesity, unspecified: Secondary | ICD-10-CM | POA: Diagnosis not present

## 2024-01-10 DIAGNOSIS — Z0000 Encounter for general adult medical examination without abnormal findings: Secondary | ICD-10-CM | POA: Diagnosis not present

## 2024-01-10 DIAGNOSIS — Z6839 Body mass index (BMI) 39.0-39.9, adult: Secondary | ICD-10-CM | POA: Diagnosis not present

## 2024-01-10 DIAGNOSIS — Z131 Encounter for screening for diabetes mellitus: Secondary | ICD-10-CM | POA: Diagnosis not present

## 2024-02-01 DIAGNOSIS — R928 Other abnormal and inconclusive findings on diagnostic imaging of breast: Secondary | ICD-10-CM | POA: Diagnosis not present

## 2024-02-09 DIAGNOSIS — R110 Nausea: Secondary | ICD-10-CM | POA: Diagnosis not present

## 2024-02-13 DIAGNOSIS — M25561 Pain in right knee: Secondary | ICD-10-CM | POA: Diagnosis not present

## 2024-02-13 DIAGNOSIS — R252 Cramp and spasm: Secondary | ICD-10-CM | POA: Diagnosis not present

## 2024-02-13 DIAGNOSIS — E118 Type 2 diabetes mellitus with unspecified complications: Secondary | ICD-10-CM | POA: Diagnosis not present

## 2024-02-13 DIAGNOSIS — M25551 Pain in right hip: Secondary | ICD-10-CM | POA: Diagnosis not present

## 2024-02-13 DIAGNOSIS — R202 Paresthesia of skin: Secondary | ICD-10-CM | POA: Diagnosis not present

## 2024-03-10 DIAGNOSIS — J029 Acute pharyngitis, unspecified: Secondary | ICD-10-CM | POA: Diagnosis not present

## 2024-04-13 DIAGNOSIS — H60313 Diffuse otitis externa, bilateral: Secondary | ICD-10-CM | POA: Diagnosis not present

## 2024-05-14 DIAGNOSIS — E118 Type 2 diabetes mellitus with unspecified complications: Secondary | ICD-10-CM | POA: Diagnosis not present

## 2024-07-09 ENCOUNTER — Emergency Department (HOSPITAL_BASED_OUTPATIENT_CLINIC_OR_DEPARTMENT_OTHER)

## 2024-07-09 ENCOUNTER — Encounter (HOSPITAL_BASED_OUTPATIENT_CLINIC_OR_DEPARTMENT_OTHER): Payer: Self-pay

## 2024-07-09 ENCOUNTER — Emergency Department (HOSPITAL_BASED_OUTPATIENT_CLINIC_OR_DEPARTMENT_OTHER)
Admission: EM | Admit: 2024-07-09 | Discharge: 2024-07-09 | Disposition: A | Discharge status: Home / Self Care | Attending: Emergency Medicine | Admitting: Emergency Medicine

## 2024-07-09 ENCOUNTER — Other Ambulatory Visit: Payer: Self-pay

## 2024-07-09 DIAGNOSIS — R29818 Other symptoms and signs involving the nervous system: Secondary | ICD-10-CM | POA: Diagnosis not present

## 2024-07-09 DIAGNOSIS — M79602 Pain in left arm: Secondary | ICD-10-CM | POA: Diagnosis not present

## 2024-07-09 DIAGNOSIS — R202 Paresthesia of skin: Secondary | ICD-10-CM | POA: Insufficient documentation

## 2024-07-09 DIAGNOSIS — R9431 Abnormal electrocardiogram [ECG] [EKG]: Secondary | ICD-10-CM | POA: Diagnosis not present

## 2024-07-09 DIAGNOSIS — Z720 Tobacco use: Secondary | ICD-10-CM | POA: Insufficient documentation

## 2024-07-09 DIAGNOSIS — J984 Other disorders of lung: Secondary | ICD-10-CM | POA: Diagnosis not present

## 2024-07-09 DIAGNOSIS — R519 Headache, unspecified: Secondary | ICD-10-CM | POA: Diagnosis not present

## 2024-07-09 DIAGNOSIS — R200 Anesthesia of skin: Secondary | ICD-10-CM | POA: Diagnosis present

## 2024-07-09 DIAGNOSIS — D1803 Hemangioma of intra-abdominal structures: Secondary | ICD-10-CM | POA: Diagnosis not present

## 2024-07-09 DIAGNOSIS — R55 Syncope and collapse: Secondary | ICD-10-CM | POA: Diagnosis not present

## 2024-07-09 DIAGNOSIS — R209 Unspecified disturbances of skin sensation: Secondary | ICD-10-CM | POA: Diagnosis not present

## 2024-07-09 LAB — CBC
HCT: 37.9 % (ref 36.0–46.0)
Hemoglobin: 11.7 g/dL — ABNORMAL LOW (ref 12.0–15.0)
MCH: 22.9 pg — ABNORMAL LOW (ref 26.0–34.0)
MCHC: 30.9 g/dL (ref 30.0–36.0)
MCV: 74 fL — ABNORMAL LOW (ref 80.0–100.0)
Platelets: 453 K/uL — ABNORMAL HIGH (ref 150–400)
RBC: 5.12 MIL/uL — ABNORMAL HIGH (ref 3.87–5.11)
RDW: 14.7 % (ref 11.5–15.5)
WBC: 9.5 K/uL (ref 4.0–10.5)
nRBC: 0 % (ref 0.0–0.2)

## 2024-07-09 LAB — URINALYSIS, ROUTINE W REFLEX MICROSCOPIC
Bilirubin Urine: NEGATIVE
Glucose, UA: NEGATIVE mg/dL
Ketones, ur: NEGATIVE mg/dL
Leukocytes,Ua: NEGATIVE
Nitrite: NEGATIVE
Protein, ur: NEGATIVE mg/dL
Specific Gravity, Urine: >=1.03 (ref 1.005–1.030)
pH: 6 (ref 5.0–8.0)

## 2024-07-09 LAB — URINALYSIS, MICROSCOPIC (REFLEX)

## 2024-07-09 LAB — BASIC METABOLIC PANEL WITH GFR
Anion gap: 12 (ref 5–15)
BUN: 9 mg/dL (ref 6–20)
CO2: 23 mmol/L (ref 22–32)
Calcium: 8.9 mg/dL (ref 8.9–10.3)
Chloride: 103 mmol/L (ref 98–111)
Creatinine, Ser: 0.82 mg/dL (ref 0.44–1.00)
GFR, Estimated: >60 mL/min (ref >60)
Glucose, Bld: 100 mg/dL — ABNORMAL HIGH (ref 70–99)
Potassium: 4.1 mmol/L (ref 3.5–5.1)
Sodium: 138 mmol/L (ref 135–145)

## 2024-07-09 LAB — PREGNANCY, URINE: Preg Test, Ur: NEGATIVE

## 2024-07-09 LAB — PROTIME-INR
INR: 0.9 (ref 0.8–1.2)
Prothrombin Time: 13.2 s (ref 11.4–15.2)

## 2024-07-09 LAB — TROPONIN T, HIGH SENSITIVITY
Troponin T High Sensitivity: <15 ng/L (ref <19)
Troponin T High Sensitivity: <15 ng/L (ref <19)

## 2024-07-09 LAB — ETHANOL: Alcohol, Ethyl (B): <15 mg/dL (ref <15)

## 2024-07-09 LAB — APTT: aPTT: 34 s (ref 24–36)

## 2024-07-09 LAB — CBG MONITORING, ED: Glucose-Capillary: 89 mg/dL (ref 70–99)

## 2024-07-09 MED ORDER — IOHEXOL 350 MG/ML SOLN
75.0000 mL | Freq: Once | INTRAVENOUS | Status: AC | PRN
  Administered 2024-07-09: 100 mL via INTRAVENOUS

## 2024-07-09 NOTE — ED Notes (Signed)
Shift report received, assumed care of patient at this time 

## 2024-07-09 NOTE — ED Triage Notes (Signed)
 Was at work and started feeling a tightness in left shoulder and arm numbness and feeling faint around 0900. Denies chest pain. Denies dizziness in triage, however continues to have numbness to left arm and abnormal feeling in left mid back.

## 2024-07-09 NOTE — ED Provider Notes (Signed)
 [place] EMERGENCY DEPARTMENT AT [facility] Provider Note   CSN: [id number] Arrival date & time: 07/09/24  1728     Patient presents with: Numbness   Samantha Harris is a 43 y.o. female with past medical history significant for panic disorder, tobacco use, obesity presents concern for tightness in left shoulder, reported arm numbness and feeling faint around 9 AM.  Denies any chest pain, shortness of breath.  She reports that she was feeling some thickness earlier but denies any dizziness or lightheadedness at this time.  Asked to clarify her numbness and she reports that she is not having total lack of sensation on that side but that feels slightly different, however on exam when I touch with her arm she says that it feels the same on both sides.   HPI     Prior to Admission medications   Medication Sig Start Date End Date Taking? Authorizing Provider  cholecalciferol (VITAMIN D3) 25 MCG (1000 UNIT) tablet Take 1,000 Units by mouth daily.    [provider]  dicyclomine (BENTYL) 10 MG capsule Take 10 mg by mouth 4 (four) times daily -  before meals and at bedtime.    [provider]  fluticasone  (FLONASE ) 50 MCG/ACT nasal spray Place 2 sprays into both nostrils daily. 07/27/21   Van Knee, MD  Multiple Vitamin (MULTIVITAMIN) capsule Take 1 capsule by mouth daily.    [provider]  Semaglutide (RYBELSUS PO) Take by mouth.    [provider]    Allergies: Patient has no known allergies.    Review of Systems  All other systems reviewed and are negative.   Updated Vital Signs BP (!) 140/83   Pulse 78   Temp 98.3 F (36.8 C) (Oral)   Resp 20   Ht 5' 7 (1.702 m)   Wt 117.9 kg   SpO2 97%   BMI 40.72 kg/m   Physical Exam Vitals and nursing note reviewed.  Constitutional:      General: She is not in acute distress.    Appearance: Normal appearance.  HENT:     Head: Normocephalic and atraumatic.  Eyes:      General:        Right eye: No discharge.        Left eye: No discharge.  Cardiovascular:     Rate and Rhythm: Normal rate and regular rhythm.     Heart sounds: No murmur heard.    No friction rub. No gallop.  Pulmonary:     Effort: Pulmonary effort is normal.     Breath sounds: Normal breath sounds.  Abdominal:     General: Bowel sounds are normal.     Palpations: Abdomen is soft.  Skin:    General: Skin is warm and dry.     Capillary Refill: Capillary refill takes less than 2 seconds.  Neurological:     Mental Status: She is alert and oriented to person, place, and time.     Comments: Cranial nerves II through XII grossly intact.  Intact finger-nose, intact heel-to-shin.  Romberg negative, gait normal.  Alert and oriented x3.  Moves all 4 limbs spontaneously, normal coordination.  No pronator drift.  Intact strength 5 out of 5 bilateral upper and lower extremities.  Psychiatric:        Mood and Affect: Mood normal.        Behavior: Behavior normal.     (all labs ordered are listed, but only abnormal results are displayed) Labs Reviewed  BASIC METABOLIC PANEL WITH GFR - Abnormal; Notable for the following components:      Result Value   Glucose, Bld 100 (*)    All other components within normal limits  CBC - Abnormal; Notable for the following components:   RBC 5.12 (*)    Hemoglobin 11.7 (*)    MCV 74.0 (*)    MCH 22.9 (*)    Platelets 453 (*)    All other components within normal limits  URINALYSIS, ROUTINE W REFLEX MICROSCOPIC - Abnormal; Notable for the following components:   Hgb urine dipstick TRACE (*)    All other components within normal limits  URINALYSIS, MICROSCOPIC (REFLEX) - Abnormal; Notable for the following components:   Bacteria, UA FEW (*)    All other components within normal limits  PREGNANCY, URINE  PROTIME-INR  APTT  ETHANOL  CBG MONITORING, ED  TROPONIN T, HIGH SENSITIVITY  TROPONIN T, HIGH SENSITIVITY    EKG: EKG  Interpretation Date/Time:  Tuesday July 09 2024 17:41:05 EDT Ventricular Rate:  73 PR Interval:  148 QRS Duration:  81 QT Interval:  375 QTC Calculation: 414 R Axis:   60  Text Interpretation: Sinus rhythm Confirmed by Elnor Hila ([id number]) on 07/09/2024 6:51:43 PM  Radiology: CT Angio Chest PE W and/or Wo Contrast Result Date: 07/09/2024 EXAM: CTA CHEST AORTA 07/09/2024 09:08:15 PM TECHNIQUE: CTA of the chest was performed after the administration of intravenous contrast. Multiplanar reformatted images are provided for review. MIP images are provided for review. Automated exposure control, iterative reconstruction, and/or weight based adjustment of the mA/kV was utilized to reduce the radiation dose to as low as reasonably achievable. COMPARISON: Chest radiographs and abdominal MRI from the same day. CLINICAL HISTORY: Nodular density on xray. 43 y/o female started feeling a tightness in left shoulder and arm numbness and feeling faint around 0900. Denies chest pain. FINDINGS: AORTA: Normal caliber thoracic aorta without dissection. No aneurysm. MEDIASTINUM: No mediastinal lymphadenopathy. The heart and pericardium demonstrate no acute abnormality. LYMPH NODES: No mediastinal, hilar or axillary lymphadenopathy. LUNGS AND PLEURA: No evidence of pulmonary embolism. The lungs are without acute process. No focal consolidation or pulmonary edema. No pleural effusion or pneumothorax. No nodular density to correspond with abnormality on same day chest radiographs. 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 Limited images of the upper abdomen are otherwise unremarkable. SOFT TISSUES AND BONES: No acute bone or soft tissue abnormality. IMPRESSION: 1. No acute findings. Electronically signed by: Norman Gatlin MD 07/09/2024 09:14 PM EDT RP Workstation: [id number]   DG Chest 2 View Result Date: 07/09/2024 CLINICAL DATA:  Left arm pain. EXAM: CHEST - 2 VIEW COMPARISON:  August 06, 2020. FINDINGS: The  heart size and mediastinal contours are within normal limits. Nodular density is noted anteriorly in the chest on lateral radiograph only. No consolidative process is noted. The visualized skeletal structures are unremarkable. IMPRESSION: Nodular density is noted anteriorly in the chest on lateral projection only. CT scan is recommended for further evaluation. Electronically Signed   By: Lynwood Landy Raddle M.D.   On: 07/09/2024 19:11   CT Head Wo Contrast Result Date: 07/09/2024 EXAM: CT HEAD WITHOUT CONTRAST 07/09/2024 06:57:32 PM TECHNIQUE: CT of the head was performed without the administration of intravenous contrast. Automated exposure control, iterative reconstruction, and/or weight based adjustment of the mA/kV was utilized to reduce the radiation dose to as low as reasonably achievable. COMPARISON: None available. CLINICAL HISTORY: Headache, neuro deficit. 43 y/o female started feeling a tightness in left shoulder  and arm numbness and feeling faint around 0900. Denies chest pain. Denies dizziness currently; however, continues to have numbness to left arm and abnormal feeling in left mid back. FINDINGS: BRAIN AND VENTRICLES: No acute hemorrhage. Gray-white differentiation is preserved. No hydrocephalus. No extra-axial collection. No mass effect or midline shift. ORBITS: No acute abnormality. SINUSES: No acute abnormality. SOFT TISSUES AND SKULL: No acute soft tissue abnormality. No skull fracture. IMPRESSION: 1. No acute intracranial abnormality. Electronically signed by: Norman Gatlin MD 07/09/2024 07:05 PM EDT RP Workstation: [id number]     Procedures   Medications Ordered in the ED  iohexol  (OMNIPAQUE ) 350 MG/ML injection 75 mL (100 mLs Intravenous Contrast Given 07/09/24 1957)                                    Medical Decision Making Amount and/or Complexity of Data Reviewed Labs: ordered. Radiology: ordered.  Risk Prescription drug management.   This patient is a 43 y.o. female   who presents to the ED for concern of left arm numbness, dizziness.   Differential diagnoses prior to evaluation: The emergent differential diagnosis includes, but is not limited to,  BPPV, vestibular migraine, head trauma, AVM, intracranial tumor, multiple sclerosis, drug-related, CVA, orthostatic hypotension, sepsis, hypoglycemia, electrolyte disturbance, anemia, anxiety, left arm numbness may also be from CVA, MS, vs panic disorder, ACS,  . This is not an exhaustive differential.   Past Medical History / Co-morbidities / Social History: panic disorder, tobacco use, obesity  Physical Exam: Physical exam performed. The pertinent findings include: Cranial nerves II through XII grossly intact.  Intact finger-nose, intact heel-to-shin.  Romberg negative, gait normal.  Alert and oriented x3.  Moves all 4 limbs spontaneously, normal coordination.  No pronator drift.  Intact strength 5 out of 5 bilateral upper and lower extremities.   No difference in sensation of left arm on my exam  Lab Tests/Imaging studies: I personally interpreted labs/imaging and the pertinent results include: CBC with very mild anemia, hemoglobin 1.7, no leukocytosis, mild elevated platelets at 453, BMP is unremarkable, UA unremarkable.  Few bacteria noted but no white blood cells, no nitrates, no leukocytes.  Negative troponin x 1, delta pending, negative pregnancy test, normal CBC, negative ethanol, APTT, PT/INR.  I independently interpreted plain, chest x-ray, CT head without contrast, no evidence of acute intrathoracic abnormality, question nodule on lateral lung x-ray with recommendation for CT to further elucidate, CT PE study shows no evidence of blood clot, infection, nodule noted on chest x-ray was not revealed on CT. I agree with the radiologist interpretation.  Cardiac monitoring: EKG obtained and interpreted by myself and attending physician which shows: Normal sinus rhythm    Disposition: After consideration of  the diagnostic results and the patients response to treatment, I feel that no emergent etiology to explain patient's symptoms today, could be related to radiculopathy, stress, anxiety, I think reasonable to place ambulatory referral to neurology if symptoms persist, otherwise okay to follow-up with primary care doctor as needed.   emergency department workup does not suggest an emergent condition requiring admission or immediate intervention beyond what has been performed at this time. The plan is: as above. The patient is safe for discharge and has been instructed to return immediately for worsening symptoms, change in symptoms or any other concerns.   Final diagnoses:  Tingling of left upper extremity    ED Discharge Orders  Ordered    Ambulatory referral to Neurology       Comments: An appointment is requested in approximately: 4 weeks   07/09/24 2125               Milani Lowenstein H, PA-C 07/09/24 2127    Elnor Hila P, DO 07/11/24 1534

## 2024-07-09 NOTE — Discharge Instructions (Signed)
 Your workup today was reassuring, the questionable nodule on your chest x-ray was not revealed on the CT scan.  The rest of your workup looked very reassuring, I did not see any evidence of a stroke or other abnormality to explain your left arm tingling.  I have placed a referral to neurology for you to follow-up and have this evaluated if it persists.

## 2024-07-09 NOTE — ED Notes (Signed)
 Patient transported to CT

## 2024-08-28 DIAGNOSIS — R928 Other abnormal and inconclusive findings on diagnostic imaging of breast: Secondary | ICD-10-CM | POA: Diagnosis not present

## 2024-08-28 DIAGNOSIS — R92322 Mammographic fibroglandular density, left breast: Secondary | ICD-10-CM | POA: Diagnosis not present

## 2024-10-04 ENCOUNTER — Ambulatory Visit: Admitting: Neurology

## 2024-11-26 ENCOUNTER — Ambulatory Visit: Admitting: Neurology

## 2024-11-26 ENCOUNTER — Encounter: Payer: Self-pay | Admitting: Neurology

## 2024-11-26 VITALS — BP 145/85 | HR 87 | Ht 67.0 in | Wt 256.5 lb

## 2024-11-26 DIAGNOSIS — G5603 Carpal tunnel syndrome, bilateral upper limbs: Secondary | ICD-10-CM | POA: Diagnosis not present

## 2024-11-26 NOTE — Patient Instructions (Signed)
 Continue current medications Consider wrist brace mainly at night Continue to follow-up with your doctors Return as needed

## 2024-11-26 NOTE — Progress Notes (Signed)
 [facility]  PATIENT: Samantha Harris DOB: June 09, 1981  REQUESTING CLINICIAN: Prosperi, Christian H, * HISTORY FROM: Patient  REASON FOR VISIT: Left arm numbness    HISTORICAL  CHIEF COMPLAINT:  Chief Complaint  Patient presents with   Establish Care    NP internal referral for tingling of left upper extremity    HISTORY OF PRESENT ILLNESS:  Discussed the use of AI scribe software for clinical note transcription with the patient, who gave verbal consent to proceed.  Samantha Harris is a 43 year old female who presents with numbness on the left side of her body.  She experienced numbness that began in her left shoulder and extended down her arm to mid-body. This episode occurred while she was at work and lasted for several days. The sensation was described as numbness and heaviness, with an inability to use the affected side. She sought medical attention at urgent care and subsequently at a hospital, where a CT scan showed no abnormalities. The numbness resolved after a few days and has not recurred in the same manner. No leg involvement was noted during the episode, and there were no recent falls or injuries.  She experiences occasional tingling and numbness in her fingers, which she attributes to being overweight. This tingling occurs sporadically, often while performing activities such as typing, writing, or washing dishes. She sometimes shakes or rubs her hands to alleviate the sensation. She reports frequent hand use due to her job at a [employer] and cooking on the side.  She works at a field seismologist, primarily using a animator, which involves repetitive wrist movements. 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 Her work involves a lot of typing and hand use.    OTHER MEDICAL CONDITIONS: Obesity, increase stress   REVIEW OF SYSTEMS: Full 14 system review of systems performed and negative with  exception of: As noted in the HPI   ALLERGIES: No Known Allergies  HOME MEDICATIONS: Outpatient Medications Prior to Visit  Medication Sig Dispense Refill   cholecalciferol (VITAMIN D3) 25 MCG (1000 UNIT) tablet Take 1,000 Units by mouth daily.     fluticasone  (FLONASE ) 50 MCG/ACT nasal spray Place 2 sprays into both nostrils daily. 16 g 0   Multiple Vitamin (MULTIVITAMIN) capsule Take 1 capsule by mouth daily.     dicyclomine (BENTYL) 10 MG capsule Take 10 mg by mouth 4 (four) times daily -  before meals and at bedtime.     Semaglutide (RYBELSUS PO) Take by mouth.     No facility-administered medications prior to visit.    PAST MEDICAL HISTORY: Past Medical History:  Diagnosis Date   Anxiety    Gallstones    HPV in female    Migraine    Vaginal Pap smear, abnormal     PAST SURGICAL HISTORY: Past Surgical History:  Procedure Laterality Date   CHOLECYSTECTOMY N/A 10/12/2020   Procedure: LAPAROSCOPIC CHOLECYSTECTOMY WITH PRIMARY UMBILICAL CLOSURE;  Surgeon: Ebbie Cough, MD;  Location:  [facility];  Service: General;  Laterality: N/A;   MOUTH SURGERY      FAMILY HISTORY: Family History  Problem Relation Age of Onset   Colon cancer Mother 43   Heart disease Father    Hypertension Father    Stroke Father    Diabetes Sister    Asthma Brother    Diabetes Paternal Grandmother    Stroke Paternal Grandmother    Clotting disorder Paternal  Grandmother     SOCIAL HISTORY: Social History   Socioeconomic History   Marital status: Single    Spouse name: Not on file   Number of children: 2   Years of education: Not on file   Highest education level: Not on file  Occupational History   Not on file  Tobacco Use   Smoking status: Former    Current packs/day: 0.00    Types: Cigarettes    Quit date: 10/2018    Years since quitting: 6.0   Smokeless tobacco: Never  Vaping Use   Vaping status: Never Used  Substance and Sexual Activity   Alcohol  use:  Yes    Comment: socially   Drug use: No    Comment: past marijuana   Sexual activity: Yes    Birth control/protection: Implant    Comment: nexplanon   Other Topics Concern   Not on file  Social History Narrative   Not on file   Social Drivers of Health   Financial Resource Strain: Low Risk  (06/04/2019)   Overall Financial Resource Strain (CARDIA)    Difficulty of Paying Living Expenses: Not hard at all  Food Insecurity: No Food Insecurity (06/04/2019)   Hunger Vital Sign    Worried About Running Out of Food in the Last Year: Never true    Ran Out of Food in the Last Year: Never true  Transportation Needs: Unknown (06/04/2019)   PRAPARE - Administrator, Civil Service (Medical): No    Lack of Transportation (Non-Medical): Not on file  Physical Activity: Not on file  Stress: No Stress Concern Present (06/04/2019)   [employer] of Occupational Health - Occupational Stress Questionnaire    Feeling of Stress : Not at all  Social Connections: Not on file  Intimate Partner Violence: Not At Risk (06/04/2019)   Humiliation, Afraid, Rape, and Kick questionnaire    Fear of Current or Ex-Partner: No    Emotionally Abused: No    Physically Abused: No    Sexually Abused: No    PHYSICAL EXAM  GENERAL EXAM/CONSTITUTIONAL: Vitals:  Vitals:   11/26/24 0941  BP: (!) 145/85  Pulse: 87  SpO2: 98%  Weight: 256 lb 8 oz (116.3 kg)  Height: 5' 7 (1.702 m)   Body mass index is 40.17 kg/m. Wt Readings from Last 3 Encounters:  11/26/24 256 lb 8 oz (116.3 kg)  07/09/24 260 lb (117.9 kg)  01/18/23 238 lb (108 kg)   Patient is in no distress; well developed, nourished and groomed; neck is supple  MUSCULOSKELETAL: Gait, strength, tone, movements noted in Neurologic exam below  NEUROLOGIC: MENTAL STATUS:      No data to display         awake, alert, oriented to person, place and time recent and remote memory intact normal attention and concentration language fluent,  comprehension intact, naming intact fund of knowledge appropriate  CRANIAL NERVE:  2nd, 3rd, 4th, 6th - Visual fields full to confrontation, extraocular muscles intact, no nystagmus 5th - facial sensation symmetric 7th - facial strength symmetric 8th - hearing intact 9th - palate elevates symmetrically, uvula midline 11th - shoulder shrug symmetric 12th - tongue protrusion midline  MOTOR:  normal bulk and tone, full strength in the BUE, BLE  SENSORY:  normal and symmetric to light touch  COORDINATION:  finger-nose-finger, fine finger movements normal  REFLEXES:  Trace reflexes present bilaterally   GAIT/STATION:  normal   DIAGNOSTIC DATA (LABS, IMAGING, TESTING) - I reviewed patient records,  labs, notes, testing and imaging myself where available.  Lab Results  Component Value Date   WBC 9.5 07/09/2024   HGB 11.7 (L) 07/09/2024   HCT 37.9 07/09/2024   MCV 74.0 (L) 07/09/2024   PLT 453 (H) 07/09/2024      Component Value Date/Time   NA 138 07/09/2024 1809   NA 139 05/24/2019 1231   K 4.1 07/09/2024 1809   CL 103 07/09/2024 1809   CO2 23 07/09/2024 1809   GLUCOSE 100 (H) 07/09/2024 1809   GLUCOSE 75 09/27/2012 1153   BUN 9 07/09/2024 1809   BUN 7 05/24/2019 1231   CREATININE 0.82 07/09/2024 1809   CALCIUM 8.9 07/09/2024 1809   PROT 6.8 08/06/2020 0726   PROT 5.9 (L) 05/24/2019 1231   ALBUMIN 3.6 08/06/2020 0726   ALBUMIN 3.3 (L) 05/24/2019 1231   AST 97 (H) 08/06/2020 0726   ALT 44 08/06/2020 0726   ALKPHOS 92 08/06/2020 0726   BILITOT 0.5 08/06/2020 0726   BILITOT <0.2 05/24/2019 1231   GFRNONAA >60 07/09/2024 1809   GFRAA >60 08/06/2020 0533   No results found for: CHOL, HDL, LDLCALC, LDLDIRECT, TRIG, CHOLHDL No results found for: YHAJ8R No results found for: VITAMINB12 No results found for: TSH  Head CT 07/09/2024 1. No acute intracranial abnormality.     ASSESSMENT AND PLAN  43 y.o. year old female with    Left upper  extremity paresthesia and weakness episode Intermittent numbness and weakness in the left upper extremity, initially presenting as a severe episode lasting several days, now resolved. Differential diagnosis includes stroke, cervical spine injury, and stress-related symptoms. Normal CT scans. No recurrence of severe symptoms. Stress and repetitive movements may contribute. - Continue to monitor symptoms and report any recurrence. - Will consider neck imaging if symptoms recur.  Carpal tunnel syndrome Intermittent numbness in fingers, particularly during repetitive activities such as typing and cooking. Symptoms suggestive of carpal tunnel syndrome, likely due to repetitive wrist movements and possibly exacerbated by obesity. Conservative management preferred over surgical intervention. - Recommended use of wrist splints at night. - Advised on conservative management strategies to alleviate symptoms.    1. Bilateral carpal tunnel syndrome      Patient Instructions  Continue current medications Consider wrist brace mainly at night Continue to follow-up with your doctors Return as needed  No orders of the defined types were placed in this encounter.   No orders of the defined types were placed in this encounter.   Return if symptoms worsen or fail to improve.  I personally spent a total of 45 minutes in the care of the patient today including preparing to see the patient, getting/reviewing separately obtained history, performing a medically appropriate exam/evaluation, counseling and educating, and documenting clinical information in the EHR.   Pastor Falling, MD 11/26/2024, 10:06 AM  [facility] [address] [place], [place] [address] [phone]
